# Patient Record
Sex: Female | Born: 1975
Health system: Southern US, Community
[De-identification: ages and names within clinical notes are randomized; demographics above are authoritative.]

## PROBLEM LIST (undated history)

## (undated) DIAGNOSIS — A749 Chlamydial infection, unspecified: Secondary | ICD-10-CM

## (undated) DIAGNOSIS — Z789 Other specified health status: Secondary | ICD-10-CM

## (undated) HISTORY — PX: HERNIA REPAIR: SHX51

## (undated) HISTORY — PX: APPENDECTOMY: SHX54

## (undated) HISTORY — PX: TUBAL LIGATION: SHX77

---

## 2000-06-13 ENCOUNTER — Emergency Department (HOSPITAL_COMMUNITY): Admission: EM | Admit: 2000-06-13 | Discharge: 2000-06-13 | Payer: Self-pay | Admitting: Emergency Medicine

## 2000-06-13 ENCOUNTER — Encounter: Payer: Self-pay | Admitting: Emergency Medicine

## 2001-09-21 ENCOUNTER — Inpatient Hospital Stay (HOSPITAL_COMMUNITY): Admission: AD | Admit: 2001-09-21 | Discharge: 2001-09-21 | Payer: Self-pay | Admitting: *Deleted

## 2002-03-10 ENCOUNTER — Emergency Department (HOSPITAL_COMMUNITY): Admission: EM | Admit: 2002-03-10 | Discharge: 2002-03-10 | Payer: Self-pay | Admitting: Emergency Medicine

## 2002-12-22 ENCOUNTER — Inpatient Hospital Stay (HOSPITAL_COMMUNITY): Admission: AD | Admit: 2002-12-22 | Discharge: 2002-12-22 | Payer: Self-pay | Admitting: Obstetrics and Gynecology

## 2002-12-22 ENCOUNTER — Encounter: Payer: Self-pay | Admitting: Obstetrics and Gynecology

## 2003-09-09 ENCOUNTER — Emergency Department (HOSPITAL_COMMUNITY): Admission: EM | Admit: 2003-09-09 | Discharge: 2003-09-09 | Payer: Self-pay | Admitting: Emergency Medicine

## 2003-09-13 ENCOUNTER — Emergency Department (HOSPITAL_COMMUNITY): Admission: EM | Admit: 2003-09-13 | Discharge: 2003-09-13 | Payer: Self-pay | Admitting: Family Medicine

## 2006-01-14 ENCOUNTER — Emergency Department (HOSPITAL_COMMUNITY): Admission: EM | Admit: 2006-01-14 | Discharge: 2006-01-14 | Payer: Self-pay | Admitting: Family Medicine

## 2006-02-13 ENCOUNTER — Emergency Department (HOSPITAL_COMMUNITY): Admission: EM | Admit: 2006-02-13 | Discharge: 2006-02-13 | Payer: Self-pay | Admitting: Family Medicine

## 2006-02-15 ENCOUNTER — Emergency Department (HOSPITAL_COMMUNITY): Admission: EM | Admit: 2006-02-15 | Discharge: 2006-02-15 | Payer: Self-pay | Admitting: Family Medicine

## 2006-03-16 ENCOUNTER — Emergency Department (HOSPITAL_COMMUNITY): Admission: EM | Admit: 2006-03-16 | Discharge: 2006-03-16 | Payer: Self-pay | Admitting: Family Medicine

## 2006-04-12 ENCOUNTER — Emergency Department (HOSPITAL_COMMUNITY): Admission: EM | Admit: 2006-04-12 | Discharge: 2006-04-12 | Payer: Self-pay | Admitting: Emergency Medicine

## 2006-05-22 ENCOUNTER — Emergency Department (HOSPITAL_COMMUNITY): Admission: EM | Admit: 2006-05-22 | Discharge: 2006-05-22 | Payer: Self-pay | Admitting: Emergency Medicine

## 2006-05-23 ENCOUNTER — Emergency Department (HOSPITAL_COMMUNITY): Admission: EM | Admit: 2006-05-23 | Discharge: 2006-05-23 | Payer: Self-pay | Admitting: Emergency Medicine

## 2007-04-28 ENCOUNTER — Emergency Department (HOSPITAL_COMMUNITY): Admission: EM | Admit: 2007-04-28 | Discharge: 2007-04-28 | Payer: Self-pay | Admitting: Emergency Medicine

## 2007-05-22 ENCOUNTER — Emergency Department (HOSPITAL_COMMUNITY): Admission: EM | Admit: 2007-05-22 | Discharge: 2007-05-22 | Payer: Self-pay | Admitting: Emergency Medicine

## 2007-12-07 ENCOUNTER — Emergency Department (HOSPITAL_COMMUNITY): Admission: EM | Admit: 2007-12-07 | Discharge: 2007-12-07 | Payer: Self-pay | Admitting: Emergency Medicine

## 2008-04-04 ENCOUNTER — Emergency Department (HOSPITAL_COMMUNITY): Admission: EM | Admit: 2008-04-04 | Discharge: 2008-04-05 | Payer: Self-pay | Admitting: *Deleted

## 2008-04-05 ENCOUNTER — Emergency Department (HOSPITAL_COMMUNITY): Admission: EM | Admit: 2008-04-05 | Discharge: 2008-04-05 | Payer: Self-pay | Admitting: Family Medicine

## 2009-08-02 ENCOUNTER — Emergency Department (HOSPITAL_COMMUNITY): Admission: EM | Admit: 2009-08-02 | Discharge: 2009-08-02 | Payer: Self-pay | Admitting: Family Medicine

## 2010-01-01 ENCOUNTER — Inpatient Hospital Stay (HOSPITAL_COMMUNITY): Admission: AD | Admit: 2010-01-01 | Discharge: 2010-01-01 | Payer: Self-pay | Admitting: Obstetrics & Gynecology

## 2010-04-10 ENCOUNTER — Emergency Department (HOSPITAL_COMMUNITY)
Admission: EM | Admit: 2010-04-10 | Discharge: 2010-04-10 | Payer: Self-pay | Source: Home / Self Care | Admitting: Family Medicine

## 2010-06-01 ENCOUNTER — Inpatient Hospital Stay (HOSPITAL_COMMUNITY)
Admission: AD | Admit: 2010-06-01 | Discharge: 2010-06-01 | Disposition: A | Discharge status: Home / Self Care | Payer: Medicaid Other | Source: Ambulatory Visit | Attending: Obstetrics & Gynecology | Admitting: Obstetrics & Gynecology

## 2010-06-01 DIAGNOSIS — R42 Dizziness and giddiness: Secondary | ICD-10-CM

## 2010-06-01 DIAGNOSIS — D649 Anemia, unspecified: Secondary | ICD-10-CM | POA: Insufficient documentation

## 2010-06-01 LAB — URINALYSIS, ROUTINE W REFLEX MICROSCOPIC
Bilirubin Urine: NEGATIVE
Hgb urine dipstick: NEGATIVE
Ketones, ur: NEGATIVE mg/dL
Nitrite: NEGATIVE
Protein, ur: NEGATIVE mg/dL
Specific Gravity, Urine: 1.015 (ref 1.005–1.030)
Urine Glucose, Fasting: NEGATIVE mg/dL
Urobilinogen, UA: 0.2 mg/dL (ref 0.0–1.0)
pH: 6.5 (ref 5.0–8.0)

## 2010-06-01 LAB — CBC
Hemoglobin: 11 g/dL — ABNORMAL LOW (ref 12.0–15.0)
MCHC: 32.3 g/dL (ref 30.0–36.0)
RBC: 3.95 MIL/uL (ref 3.87–5.11)

## 2010-06-01 LAB — COMPREHENSIVE METABOLIC PANEL
AST: 23 U/L (ref 0–37)
Albumin: 3.9 g/dL (ref 3.5–5.2)
Chloride: 103 mEq/L (ref 96–112)
Creatinine, Ser: 0.81 mg/dL (ref 0.4–1.2)
GFR calc Af Amer: >60 mL/min (ref >60)
Potassium: 3.6 mEq/L (ref 3.5–5.1)
Total Bilirubin: 0.3 mg/dL (ref 0.3–1.2)

## 2010-06-01 LAB — POCT PREGNANCY, URINE: Preg Test, Ur: NEGATIVE

## 2010-06-18 LAB — POCT URINALYSIS DIPSTICK
Ketones, ur: NEGATIVE mg/dL
Protein, ur: NEGATIVE mg/dL
pH: 6.5 (ref 5.0–8.0)

## 2010-06-21 LAB — URINALYSIS, ROUTINE W REFLEX MICROSCOPIC
Bilirubin Urine: NEGATIVE
Glucose, UA: NEGATIVE mg/dL
Hgb urine dipstick: NEGATIVE
Specific Gravity, Urine: 1.02 (ref 1.005–1.030)
Urobilinogen, UA: 0.2 mg/dL (ref 0.0–1.0)
pH: 7.5 (ref 5.0–8.0)

## 2010-06-21 LAB — GC/CHLAMYDIA PROBE AMP, GENITAL
Chlamydia, DNA Probe: NEGATIVE
GC Probe Amp, Genital: NEGATIVE

## 2010-06-21 LAB — CBC
Platelets: 282 10*3/uL (ref 150–400)
RBC: 3.62 MIL/uL — ABNORMAL LOW (ref 3.87–5.11)
WBC: 7.7 10*3/uL (ref 4.0–10.5)

## 2010-06-21 LAB — POCT PREGNANCY, URINE: Preg Test, Ur: NEGATIVE

## 2010-06-21 LAB — COMPREHENSIVE METABOLIC PANEL
AST: 19 U/L (ref 0–37)
Albumin: 4 g/dL (ref 3.5–5.2)
Alkaline Phosphatase: 47 U/L (ref 39–117)
Chloride: 102 mEq/L (ref 96–112)
GFR calc Af Amer: >60 mL/min (ref >60)
Potassium: 3.8 mEq/L (ref 3.5–5.1)
Sodium: 136 mEq/L (ref 135–145)
Total Bilirubin: 0.4 mg/dL (ref 0.3–1.2)
Total Protein: 6.7 g/dL (ref 6.0–8.3)

## 2010-06-21 LAB — WET PREP, GENITAL

## 2010-06-21 LAB — URINE MICROSCOPIC-ADD ON

## 2010-06-26 LAB — POCT URINALYSIS DIP (DEVICE)
Glucose, UA: NEGATIVE mg/dL
Ketones, ur: NEGATIVE mg/dL
Specific Gravity, Urine: 1.02 (ref 1.005–1.030)
Urobilinogen, UA: 0.2 mg/dL (ref 0.0–1.0)

## 2010-06-26 LAB — GC/CHLAMYDIA PROBE AMP, GENITAL: GC Probe Amp, Genital: NEGATIVE

## 2010-06-26 LAB — WET PREP, GENITAL: Trich, Wet Prep: NONE SEEN

## 2010-06-26 LAB — POCT PREGNANCY, URINE: Preg Test, Ur: NEGATIVE

## 2010-08-27 ENCOUNTER — Ambulatory Visit: Payer: Self-pay | Admitting: Family Medicine

## 2010-08-28 ENCOUNTER — Encounter: Payer: Self-pay | Admitting: Family Medicine

## 2010-08-28 ENCOUNTER — Ambulatory Visit (INDEPENDENT_AMBULATORY_CARE_PROVIDER_SITE_OTHER): Payer: Medicaid Other | Admitting: Family Medicine

## 2010-08-28 VITALS — BP 118/78 | HR 82 | Temp 99.0°F | Ht 64.5 in | Wt 120.0 lb

## 2010-08-28 DIAGNOSIS — N6459 Other signs and symptoms in breast: Secondary | ICD-10-CM

## 2010-08-28 DIAGNOSIS — N6019 Diffuse cystic mastopathy of unspecified breast: Secondary | ICD-10-CM | POA: Insufficient documentation

## 2010-08-28 DIAGNOSIS — Z87891 Personal history of nicotine dependence: Secondary | ICD-10-CM

## 2010-08-28 DIAGNOSIS — IMO0002 Reserved for concepts with insufficient information to code with codable children: Secondary | ICD-10-CM

## 2010-08-28 DIAGNOSIS — F1911 Other psychoactive substance abuse, in remission: Secondary | ICD-10-CM

## 2010-08-28 DIAGNOSIS — F17201 Nicotine dependence, unspecified, in remission: Secondary | ICD-10-CM | POA: Insufficient documentation

## 2010-08-28 NOTE — Patient Instructions (Signed)
It was nice to meet you.  I want you to make an appointment for your Well Woman Check up- and we will do your pap smear at that time.  It is OK to take tylenol or ibuprofen over the counter for your breast pain.

## 2010-08-29 ENCOUNTER — Encounter: Payer: Self-pay | Admitting: Family Medicine

## 2010-08-29 DIAGNOSIS — IMO0002 Reserved for concepts with insufficient information to code with codable children: Secondary | ICD-10-CM | POA: Insufficient documentation

## 2010-08-29 DIAGNOSIS — F1911 Other psychoactive substance abuse, in remission: Secondary | ICD-10-CM | POA: Insufficient documentation

## 2010-08-29 NOTE — Assessment & Plan Note (Signed)
Patient has been tobacco free for one year and is very excited about this.  She smoked for >15 years, so significant exposure, will monitor for adverse affects.

## 2010-08-29 NOTE — Assessment & Plan Note (Signed)
Pt with history of Marijuana use, but seems to have quit for good.  Will continue to monitor, especially during stressful times for her.

## 2010-08-29 NOTE — Progress Notes (Signed)
  Subjective:    Patient ID: Jordan Leblanc, female    DOB: 1975-04-24, 35 y.o.   MRN: [id number]  HPI Jordan Leblanc presents to establish care and has a complaint of bilateral breast pain.  She says she has always had pain in both breasts, and feels knotty masses.  These masses have not changed, and have been there for years.  The pain she experiences is significant, and she says it hurts all the time no matter what time of month it is.  She has occassionally taken Ibuprofen or tylenol for the pain.  She has never had any skin changes or nipple drainage.  She bottle fed her 3 children.   Patient also complains that she has pain with sexual activity.  She says she has always had this, but does not associate it with any urinary symptoms.    Patient does not know when her last pap smear is.  She is currently sexually active, and not using protection (s/p tubal ligation). She denies any vaginal symptoms (drainage, odor, pain when not having intercourse).    Patient quit smoking cigarettes about a year ago, and has recently quit smoking marijuana. She is very proud of these accomplishments.    Review of Systems  Constitutional: Negative for fever and unexpected weight change.  HENT: Negative for rhinorrhea.   Eyes: Negative for visual disturbance.  Respiratory: Negative for shortness of breath.   Cardiovascular: Negative for chest pain.  Gastrointestinal: Negative for abdominal pain.  Genitourinary: Negative for dysuria, difficulty urinating and genital sores.  Musculoskeletal: Negative for arthralgias.  Skin: Negative for rash.  Neurological: Negative for dizziness.  Psychiatric/Behavioral: Negative for dysphoric mood.       Objective:   Physical Exam BP 118/78  Pulse 82  Temp(Src) 99 F (37.2 C) (Oral)  Ht 5' 4.5" (1.638 m)  Wt 120 lb (54.432 kg)  BMI 20.28 kg/m2  LMP 08/07/2010 General appearance: alert, cooperative and no distress Head: Normocephalic, without obvious abnormality,  atraumatic Eyes: conjunctivae/corneas clear. PERRL, EOM's intact. Fundi benign. Ears: normal TM's and external ear canals both ears Throat: lips, mucosa, and tongue normal; teeth and gums normal Lungs: clear to auscultation bilaterally Breasts: No nipple retraction or dimpling, No nipple discharge or bleeding, No axillary or supraclavicular adenopathy, positive findings: fibrocystic changes Heart: regular rate and rhythm, S1, S2 normal, no murmur, click, rub or gallop Abdomen: soft, non-tender; bowel sounds normal; no masses,  no organomegaly Extremities: extremities normal, atraumatic, no cyanosis or edema Pulses: 2+ and symmetric       Assessment & Plan:

## 2010-08-29 NOTE — Assessment & Plan Note (Signed)
Patient does not have any exam findings concerning for malignancy, and has no family history.  Reviewed dietary recommendations for Fibrocystic breast changes, and reassured her. Hand out given.

## 2010-08-29 NOTE — Assessment & Plan Note (Signed)
Unclear what or if the cause of this is at this time.  Have asked patient to schedule her pap/well woman for next visit, will follow up.

## 2010-10-12 ENCOUNTER — Other Ambulatory Visit (HOSPITAL_COMMUNITY)
Admission: RE | Admit: 2010-10-12 | Discharge: 2010-10-12 | Disposition: A | Discharge status: Home / Self Care | Payer: Medicaid Other | Source: Ambulatory Visit | Attending: Family Medicine | Admitting: Family Medicine

## 2010-10-12 ENCOUNTER — Ambulatory Visit (INDEPENDENT_AMBULATORY_CARE_PROVIDER_SITE_OTHER): Payer: Medicaid Other | Admitting: Family Medicine

## 2010-10-12 ENCOUNTER — Encounter: Payer: Self-pay | Admitting: Family Medicine

## 2010-10-12 DIAGNOSIS — Z7251 High risk heterosexual behavior: Secondary | ICD-10-CM

## 2010-10-12 DIAGNOSIS — Z124 Encounter for screening for malignant neoplasm of cervix: Secondary | ICD-10-CM

## 2010-10-12 DIAGNOSIS — R5381 Other malaise: Secondary | ICD-10-CM

## 2010-10-12 DIAGNOSIS — R5383 Other fatigue: Secondary | ICD-10-CM | POA: Insufficient documentation

## 2010-10-12 DIAGNOSIS — K029 Dental caries, unspecified: Secondary | ICD-10-CM | POA: Insufficient documentation

## 2010-10-12 DIAGNOSIS — N760 Acute vaginitis: Secondary | ICD-10-CM

## 2010-10-12 DIAGNOSIS — A5901 Trichomonal vulvovaginitis: Secondary | ICD-10-CM

## 2010-10-12 DIAGNOSIS — Z01419 Encounter for gynecological examination (general) (routine) without abnormal findings: Secondary | ICD-10-CM | POA: Insufficient documentation

## 2010-10-12 LAB — POCT WET PREP (WET MOUNT): KOH Wet Prep POC: POSITIVE

## 2010-10-12 LAB — CBC
HCT: 34.7 % — ABNORMAL LOW (ref 36.0–46.0)
Hemoglobin: 11.7 g/dL — ABNORMAL LOW (ref 12.0–15.0)
MCH: 29.9 pg (ref 26.0–34.0)
MCV: 88.7 fL (ref 78.0–100.0)
Platelets: 274 10*3/uL (ref 150–400)
RBC: 3.91 MIL/uL (ref 3.87–5.11)
WBC: 5.9 10*3/uL (ref 4.0–10.5)

## 2010-10-12 MED ORDER — TRAMADOL HCL 50 MG PO TABS: 50.0000 mg | ORAL_TABLET | Freq: Four times a day (QID) | ORAL | Status: AC | PRN

## 2010-10-12 MED ORDER — METRONIDAZOLE 500 MG PO TABS: 2000.0000 mg | ORAL_TABLET | Freq: Once | ORAL | Status: AC

## 2010-10-12 MED ORDER — PENICILLIN V POTASSIUM 500 MG PO TABS: 500.0000 mg | ORAL_TABLET | Freq: Three times a day (TID) | ORAL | Status: AC

## 2010-10-12 NOTE — Patient Instructions (Signed)
It was good to see you, I am sorry you have not been feeling well.  I am treating you for trichomonas, a vaginal infection.  Your partner must be treated too. I have written a prescription for an antibiotic and tramadol to help with your tooth pain and swelling, but you need to see a dentist very soon, and may need that tooth pulled out.

## 2010-10-13 ENCOUNTER — Encounter: Payer: Self-pay | Admitting: Family Medicine

## 2010-10-13 LAB — GC/CHLAMYDIA PROBE AMP, GENITAL: GC Probe Amp, Genital: NEGATIVE

## 2010-10-13 NOTE — Assessment & Plan Note (Signed)
Wet prep + for trich, Rx Metronidazole for Trichomonas, did ask patient to notify ex-partner, advised about re-infection.   Pap also done as patient is overdue, G/C Chlamydia sent, Will Draw HIV and RPR.

## 2010-10-13 NOTE — Assessment & Plan Note (Signed)
Metronidazole. See Risky Sexual behavior plan.

## 2010-10-13 NOTE — Progress Notes (Signed)
Subjective:     Jordan Leblanc is a 35 y.o. female who presents for evaluation of an abnormal vaginal discharge. Symptoms have been present for 1 week. Vaginal symptoms: discharge described as white and malodorous, pain and vulvar itching. Contraception: tubal ligation. She denies abnormal bleeding, bumps, burning and urinary symptoms of chills, dysuria and urinary frequency Sexually transmitted infection risk: possible STD exposure and she states she recently found out her significant other was cheating on her, she says she is no longer seeing him but is worried about STD's and wants to be checked for everything. . Menstrual flow: regular every 28-30 days.  Patient also complains of tooth pain in a upper left sided molar.  She has had to have teeth pulled in the past but has not seen a dentist in at least a year.  She says she felt like part of that tooth chipped off.  She has been having some significant pain with chewing, feels like her gums have been swollen.  She denies any bleeding, drainage, or fevers.    Review of Systems Pertinent items are noted in HPI.    Objective:    BP 105/69  Pulse 90  Temp(Src) 98.3 F (36.8 C) (Oral)  Wt 116 lb (52.617 kg)  LMP 10/05/2010 General appearance: alert, cooperative and no distress Throat: abnormal findings: dentition: some mild erythema or upper gums, no over puss or abscess, tooth appears damaged.  No oral lesions.  Abdomen: soft, non-tender; bowel sounds normal; no masses,  no organomegaly Pelvic: external genitalia normal, no cervical motion tenderness, rectovaginal septum normal, uterus normal size, shape, and consistency, vagina normal without discharge and Cervix is friable, there is copious odorous grey discharge, no lesions of vaginal wall.     Assessment:    Trichomonas vaginalis. , Dental carry, likely chipped tooth   Plan:   Will Rx Metronidazole for Trichomonas, did ask patient to notify ex-partner, advised about re-infection.     Pap also done as patient is overdue, G/C Chlamydia sent, Will Draw HIV and RPR.  Rx PenVK for tooth infection and Tramadol for pain.  Advised sdeing dentitst ASAP.

## 2010-10-13 NOTE — Assessment & Plan Note (Signed)
Hx of anemia, recheck CBC.

## 2010-10-13 NOTE — Assessment & Plan Note (Signed)
Rx PenVK for tooth infection and Tramadol for pain.  Advised sdeing dentitst ASAP.

## 2010-10-29 ENCOUNTER — Encounter: Payer: Self-pay | Admitting: Family Medicine

## 2010-10-29 ENCOUNTER — Other Ambulatory Visit: Payer: Self-pay | Admitting: Family Medicine

## 2010-10-29 MED ORDER — FERROUS SULFATE 325 (65 FE) MG PO TABS: 325.0000 mg | ORAL_TABLET | Freq: Two times a day (BID) | ORAL | Status: DC

## 2010-11-06 ENCOUNTER — Telehealth: Payer: Self-pay | Admitting: Family Medicine

## 2010-11-06 NOTE — Telephone Encounter (Signed)
Called pt, she was not home.  Asked daughter to let her know I called and to call back.  Would advise she make an appointment to be seen if she is feeling so bad.  She should finish Flagyl.

## 2010-11-06 NOTE — Telephone Encounter (Signed)
One of the medications is making her so sick to take and would like something else.  She is having bad reactions and she also needs a note pertaining to the medications she is taking.  Is not able to eat anything.  Please give her a call.

## 2010-11-06 NOTE — Telephone Encounter (Signed)
Pt states that ever since taking the flagyl 2 weeks ago she has been sick to her stomach has no appetite.  Also the tramadol has made her feel bad as well.  She has not made a dentist appt yet but will soon.  And needs a note stating that she is taking these meds and they are making her sick for her job.  Advised that MD may want her to be seen again.  Will forward to MD Joye Wesenberg, Maryjo Rochester

## 2010-11-07 MED ORDER — NAPROXEN 500 MG PO TABS: 500.0000 mg | ORAL_TABLET | Freq: Two times a day (BID) | ORAL | Status: AC

## 2010-11-07 NOTE — Telephone Encounter (Signed)
Rx Naproxen sent to pharmacy, letter written.  Please let her know. She needs to see the dentist.  Will not Rx narcotics in this pt as she has history of substance abuse problems.

## 2010-11-07 NOTE — Telephone Encounter (Signed)
Pt has finished flagyl - she had to stop the Tramadol b/c it is making her sick.  She needs a note stating that she was given pain meds and it made her sick.  She is wanting a different kind of pain med that would help her.  She does not feel that she needs to come in again.  She just needed to switch her pain med.  She is continuing to take her pvn.

## 2010-11-28 ENCOUNTER — Emergency Department (HOSPITAL_COMMUNITY)
Admission: EM | Admit: 2010-11-28 | Discharge: 2010-11-29 | Disposition: A | Discharge status: Home / Self Care | Payer: Medicaid Other | Attending: Emergency Medicine | Admitting: Emergency Medicine

## 2010-11-28 DIAGNOSIS — S61409A Unspecified open wound of unspecified hand, initial encounter: Secondary | ICD-10-CM | POA: Insufficient documentation

## 2010-11-28 DIAGNOSIS — M79609 Pain in unspecified limb: Secondary | ICD-10-CM | POA: Insufficient documentation

## 2010-11-28 DIAGNOSIS — W268XXA Contact with other sharp object(s), not elsewhere classified, initial encounter: Secondary | ICD-10-CM | POA: Insufficient documentation

## 2010-12-27 LAB — URINALYSIS, ROUTINE W REFLEX MICROSCOPIC
Glucose, UA: NEGATIVE
Ketones, ur: NEGATIVE
Nitrite: POSITIVE — AB
Protein, ur: >300 — AB
Specific Gravity, Urine: 1.019
pH: 7

## 2010-12-28 LAB — URINALYSIS, ROUTINE W REFLEX MICROSCOPIC
Glucose, UA: NEGATIVE
Hgb urine dipstick: NEGATIVE
Ketones, ur: NEGATIVE
pH: 6.5

## 2010-12-28 LAB — WET PREP, GENITAL: Trich, Wet Prep: NONE SEEN

## 2011-01-04 ENCOUNTER — Encounter: Payer: Self-pay | Admitting: Family Medicine

## 2011-01-04 ENCOUNTER — Ambulatory Visit (INDEPENDENT_AMBULATORY_CARE_PROVIDER_SITE_OTHER): Payer: Medicaid Other | Admitting: Family Medicine

## 2011-01-04 VITALS — BP 125/91 | HR 115 | Temp 98.1°F | Ht 64.5 in | Wt 111.0 lb

## 2011-01-04 DIAGNOSIS — A5901 Trichomonal vulvovaginitis: Secondary | ICD-10-CM

## 2011-01-04 MED ORDER — METRONIDAZOLE 500 MG PO TABS: 500.0000 mg | ORAL_TABLET | Freq: Two times a day (BID) | ORAL | Status: DC

## 2011-01-04 NOTE — Patient Instructions (Signed)
Take the medication as prescribed.  Please do not have unprotected sex with partners that may be risky.

## 2011-01-04 NOTE — Progress Notes (Signed)
  Subjective:    Patient ID: Jordan Leblanc, female    DOB: 1976/03/24, 35 y.o.   MRN: [id number]  HPI 1. Trichomonas Patient has recurrent trichomonas infection. She was recently treated two months ago for trich. She has had sexual intercourse with the same female that gave her trich the first time. She has fishy, thin, foul smelling, grey vaginal discharge and abdominal discomfort.   Review of Systems  Constitutional: Negative for fever and chills.  Respiratory: Negative for shortness of breath.   Cardiovascular: Negative for chest pain.  Gastrointestinal: Positive for abdominal pain. Negative for nausea, vomiting, diarrhea, constipation, blood in stool, abdominal distention and rectal pain.  Genitourinary: Positive for vaginal discharge. Negative for dysuria, hematuria, decreased urine volume, vaginal bleeding, difficulty urinating and vaginal pain.  Skin: Negative for rash.       Objective:   Physical Exam  Nursing note and vitals reviewed. Constitutional: She appears well-developed and well-nourished. No distress.  Cardiovascular: Normal rate, regular rhythm and normal heart sounds.   No murmur heard. Pulmonary/Chest: Effort normal and breath sounds normal. No respiratory distress. She has no wheezes.  Abdominal: Soft. Bowel sounds are normal. She exhibits no distension. There is no tenderness. There is no rebound.  Skin: She is not diaphoretic.  genital: deferred    Assessment & Plan:  1. Trichomonas Recent infection with identical symptoms Will treat with Flagyl 500 mg BID for 5 days. Advised to only have protected sexual intercourse.

## 2011-01-04 NOTE — Assessment & Plan Note (Signed)
Flagyl 500 mg BID for 5 days. Recurrent From same partner who was treated previously.

## 2011-01-11 LAB — POCT URINALYSIS DIP (DEVICE)
Ketones, ur: 15 mg/dL — AB
Specific Gravity, Urine: 1.02 (ref 1.005–1.030)
pH: 5.5 (ref 5.0–8.0)

## 2011-01-11 LAB — WET PREP, GENITAL: Yeast Wet Prep HPF POC: NONE SEEN

## 2011-01-11 LAB — POCT PREGNANCY, URINE: Preg Test, Ur: NEGATIVE

## 2011-02-07 ENCOUNTER — Encounter: Payer: Self-pay | Admitting: Family Medicine

## 2011-02-07 ENCOUNTER — Ambulatory Visit (INDEPENDENT_AMBULATORY_CARE_PROVIDER_SITE_OTHER): Payer: Medicaid Other | Admitting: Family Medicine

## 2011-02-07 VITALS — BP 122/75 | HR 95 | Temp 97.3°F | Ht 64.6 in | Wt 108.0 lb

## 2011-02-07 DIAGNOSIS — A499 Bacterial infection, unspecified: Secondary | ICD-10-CM

## 2011-02-07 DIAGNOSIS — A5901 Trichomonal vulvovaginitis: Secondary | ICD-10-CM

## 2011-02-07 DIAGNOSIS — B9689 Other specified bacterial agents as the cause of diseases classified elsewhere: Secondary | ICD-10-CM

## 2011-02-07 DIAGNOSIS — N760 Acute vaginitis: Secondary | ICD-10-CM

## 2011-02-07 DIAGNOSIS — Z23 Encounter for immunization: Secondary | ICD-10-CM

## 2011-02-07 LAB — POCT WET PREP (WET MOUNT)
Trichomonas Wet Prep HPF POC: NEGATIVE
Yeast Wet Prep HPF POC: NEGATIVE

## 2011-02-07 MED ORDER — METRONIDAZOLE 500 MG PO TABS: 500.0000 mg | ORAL_TABLET | Freq: Two times a day (BID) | ORAL | Status: AC

## 2011-02-07 NOTE — Patient Instructions (Signed)
Bacterial Vaginosis Bacterial vaginosis (BV) is a vaginal infection where the normal balance of bacteria in the vagina is disrupted. The normal balance is then replaced by an overgrowth of certain bacteria. There are several different kinds of bacteria that can cause BV. BV is the most common vaginal infection in women of childbearing age. CAUSES   The cause of BV is not fully understood. BV develops when there is an increase or imbalance of harmful bacteria.   Some activities or behaviors can upset the normal balance of bacteria in the vagina and put women at increased risk including:   Having a new sex partner or multiple sex partners.   Douching.   Using an intrauterine device (IUD) for contraception.   It is not clear what role sexual activity plays in the development of BV. However, women that have never had sexual intercourse are rarely infected with BV.  Women do not get BV from toilet seats, bedding, swimming pools or from touching objects around them.  SYMPTOMS   Grey vaginal discharge.   A fish-like odor with discharge, especially after sexual intercourse.   Itching or burning of the vagina and vulva.   Burning or pain with urination.   Some women have no signs or symptoms at all.  DIAGNOSIS  Your caregiver must examine the vagina for signs of BV. Your caregiver will perform lab tests and look at the sample of vaginal fluid through a microscope. They will look for bacteria and abnormal cells (clue cells), a pH test higher than 4.5, and a positive amine test all associated with BV.  RISKS AND COMPLICATIONS   Pelvic inflammatory disease (PID).   Infections following gynecology surgery.   Developing HIV.   Developing herpes virus.  TREATMENT  Sometimes BV will clear up without treatment. However, all women with symptoms of BV should be treated to avoid complications, especially if gynecology surgery is planned. Female partners generally do not need to be treated. However,  BV may spread between female sex partners so treatment is helpful in preventing a recurrence of BV.   BV may be treated with antibiotics. The antibiotics come in either pill or vaginal cream forms. Either can be used with nonpregnant or pregnant women, but the recommended dosages differ. These antibiotics are not harmful to the baby.   BV can recur after treatment. If this happens, a second round of antibiotics will often be prescribed.   Treatment is important for pregnant women. If not treated, BV can cause a premature delivery, especially for a pregnant woman who had a premature birth in the past. All pregnant women who have symptoms of BV should be checked and treated.   For chronic reoccurrence of BV, treatment with a type of prescribed gel vaginally twice a week is helpful.  HOME CARE INSTRUCTIONS   Finish all medication as directed by your caregiver.   Do not have sex until treatment is completed.   Tell your sexual partner that you have a vaginal infection. They should see their caregiver and be treated if they have problems, such as a mild rash or itching.   Practice safe sex. Use condoms. Only have 1 sex partner.  PREVENTION  Basic prevention steps can help reduce the risk of upsetting the natural balance of bacteria in the vagina and developing BV:  Do not have sexual intercourse (be abstinent).   Do not douche.   Use all of the medicine prescribed for treatment of BV, even if the signs and symptoms go away.     Tell your sex partner if you have BV. That way, they can be treated, if needed, to prevent reoccurrence.  SEEK MEDICAL CARE IF:   Your symptoms are not improving after 3 days of treatment.   You have increased discharge, pain, or fever.  MAKE SURE YOU:   Understand these instructions.   Will watch your condition.   Will get help right away if you are not doing well or get worse.  FOR MORE INFORMATION  Division of STD Prevention (DSTDP), Centers for Disease  Control and Prevention: www.cdc.gov/std American Social Health Association (ASHA): www.ashastd.org  Document Released: 03/25/2005 Document Revised: 12/05/2010 Document Reviewed: 09/15/2008 Exit[employer] Patient Information 2012 [employer]. 

## 2011-02-07 NOTE — Progress Notes (Signed)
  Subjective:    Patient ID: Jordan Leblanc, female    DOB: Mar 30, 1976, 35 y.o.   MRN: [id number]  HPI  Patient presents to clinic with foul vaginal odor.  Symptoms started yesterday.  Patient has had one sexual partner in the last 6 months, but she thinks he keeps giving her Trich.  She was positive for Trich in July - both patient and partner were treated at that time.  This time she complains of pain with intercourse, pelvic pain (intermittent, shooting), and associated low back pain.  Denies fever, chills, night sweats.  Denies vaginal bleeding.  Denies dysuria, burning, or vaginal itching.  Endorses scant clear vaginal discharge.   Review of Systems  Per HPI    Objective:   Physical Exam  Constitutional: No distress.  Abdominal: Soft. She exhibits no distension. There is no tenderness. There is no rebound and no guarding.  Genitourinary: Vagina normal. There is no rash, tenderness, lesion or injury on the right labia. There is no rash, tenderness, lesion or injury on the left labia. Cervix exhibits discharge. Cervix exhibits no motion tenderness and no friability. Right adnexum displays no mass and no tenderness. Left adnexum displays no mass and no tenderness.  Skin: Skin is dry. No rash noted. No erythema.  MSK: negative for back pain, negative Loyd's sign       Assessment & Plan:

## 2011-02-07 NOTE — Assessment & Plan Note (Signed)
Wet prep positive for BV. Flagyl 500 BID x 7 days. Discussed results with patient, handout given. Follow up with PCP if symptoms worsen.

## 2011-02-08 LAB — GC/CHLAMYDIA PROBE AMP, GENITAL: GC Probe Amp, Genital: NEGATIVE

## 2012-06-25 ENCOUNTER — Emergency Department (HOSPITAL_COMMUNITY)
Admission: EM | Admit: 2012-06-25 | Discharge: 2012-06-25 | Disposition: A | Discharge status: Home / Self Care | Payer: Self-pay | Attending: Emergency Medicine | Admitting: Emergency Medicine

## 2012-06-25 ENCOUNTER — Encounter (HOSPITAL_COMMUNITY): Payer: Self-pay | Admitting: Emergency Medicine

## 2012-06-25 DIAGNOSIS — M25512 Pain in left shoulder: Secondary | ICD-10-CM

## 2012-06-25 DIAGNOSIS — F17200 Nicotine dependence, unspecified, uncomplicated: Secondary | ICD-10-CM | POA: Insufficient documentation

## 2012-06-25 DIAGNOSIS — M79602 Pain in left arm: Secondary | ICD-10-CM

## 2012-06-25 DIAGNOSIS — R200 Anesthesia of skin: Secondary | ICD-10-CM

## 2012-06-25 DIAGNOSIS — M542 Cervicalgia: Secondary | ICD-10-CM | POA: Insufficient documentation

## 2012-06-25 DIAGNOSIS — R209 Unspecified disturbances of skin sensation: Secondary | ICD-10-CM | POA: Insufficient documentation

## 2012-06-25 DIAGNOSIS — M25519 Pain in unspecified shoulder: Secondary | ICD-10-CM | POA: Insufficient documentation

## 2012-06-25 MED ORDER — KETOROLAC TROMETHAMINE 60 MG/2ML IM SOLN
60.0000 mg | Freq: Once | INTRAMUSCULAR | Status: AC
  Administered 2012-06-25: 60 mg via INTRAMUSCULAR
  Filled 2012-06-25: qty 2

## 2012-06-25 MED ORDER — ORPHENADRINE CITRATE 30 MG/ML IJ SOLN: 60.0000 mg | Freq: Two times a day (BID) | INTRAMUSCULAR | Status: DC

## 2012-06-25 MED ORDER — ORPHENADRINE CITRATE ER 100 MG PO TB12
100.0000 mg | ORAL_TABLET | Freq: Once | ORAL | Status: AC
  Administered 2012-06-25: 100 mg via ORAL
  Filled 2012-06-25: qty 1

## 2012-06-25 MED ORDER — IBUPROFEN 600 MG PO TABS: 600.0000 mg | ORAL_TABLET | Freq: Three times a day (TID) | ORAL | Status: DC

## 2012-06-25 NOTE — ED Provider Notes (Addendum)
37 year old female has a job involving some repetitive motion and she came in complaining of pain in her left arm and some tingling in her left hand. Symptoms are more on the ulnar aspect. On exam, she does has weakness of abduction of her fingers and some weakness of flexion of her fourth and fifth fingers. There is some decreased sensation over the ulnar aspect of the hand and this is felt to be consistent with an ulnar neuropathy. Symptoms are reproduced by percussion over the olecranon. As she feels better after receiving NSAIDs in the ED and she will be referred to hand surgery for followup.  ECG shows normal sinus rhythm with a rate of 73, no ectopy. Normal axis. Normal P wave. Normal QRS. Normal intervals. Normal ST and T waves. Impression: normal ECG. No prior ECG available for comparison  I saw and evaluated the patient, reviewed the resident's note and I agree with the findings and plan.   Dione Booze, MD 06/25/12 1204  Dione Booze, MD 06/26/12 2240

## 2012-06-25 NOTE — ED Notes (Signed)
Waiting on medication from pharmacy.  Pharmacy called, they are sending medicine.

## 2012-06-25 NOTE — ED Notes (Signed)
Patient states that she started having pains on her L side.  Patient states that she started having pain all over her L side.  Patient states her arm feels numb at times and is painful at times.  Patient denies any other symptoms.   Patient claims cannot grip with L hand because it hurts too bad.

## 2012-06-25 NOTE — ED Notes (Signed)
Medication order sent to pharmacy for PO medication.  Pt made aware of delay in pain medication administration.  States an understanding.

## 2012-06-25 NOTE — ED Provider Notes (Signed)
History     CSN: [id number]  Arrival date & time 06/25/12  6213   First MD Initiated Contact with Patient 06/25/12 [phone]      Chief Complaint  Patient presents with  . Arm Pain    left  . Leg Pain    left    (Consider location/radiation/quality/duration/timing/severity/associated sxs/prior treatment) Patient is a 37 y.o. female presenting with arm pain. The history is provided by the patient.  Arm Pain This is a new problem. The current episode started in the past 7 days. The problem occurs constantly. The problem has been gradually worsening. Pertinent negatives include no abdominal pain, chest pain, fever or headaches. Associated symptoms comments: tingling. Nothing aggravates the symptoms. She has tried NSAIDs for the symptoms. The treatment provided mild relief.    History reviewed. No pertinent past medical history.  Past Surgical History  Procedure Laterality Date  . Appendectomy    . Hernia repair    . Tubal ligation      Family History  Problem Relation Age of Onset  . Heart disease Mother   . Hyperlipidemia Mother   . Hypertension Mother   . Diabetes Father   . Heart disease Father   . Hyperlipidemia Father   . Hypertension Father   . Cancer Father   . Stroke Father   . Asthma Maternal Aunt     History  Substance Use Topics  . Smoking status: Current Every Day Smoker -- 1.00 packs/day    Types: Cigars  . Smokeless tobacco: Not on file  . Alcohol Use: No     Comment: Pt only has a drink a few times a year.     OB History   Grav Para Term Preterm Abortions TAB SAB Ect Mult Living                  Review of Systems  Constitutional: Negative for fever.  Respiratory: Negative for shortness of breath.   Cardiovascular: Negative for chest pain.  Gastrointestinal: Negative for abdominal pain.  Neurological: Negative for headaches.  All other systems reviewed and are negative.    Allergies  Review of patient's allergies indicates no known  allergies.  Home Medications  No current outpatient prescriptions on file.  BP 127/77  Pulse 82  Temp(Src) 98.4 F (36.9 C)  Resp 18  Ht 5\' 4"  (1.626 m)  Wt 105 lb (47.628 kg)  BMI 18.01 kg/m2  SpO2 100%  LMP 06/25/2012  Physical Exam  Nursing note and vitals reviewed. Constitutional: She is oriented to person, place, and time. She appears well-developed and well-nourished. No distress.  HENT:  Head: Normocephalic and atraumatic.  Mouth/Throat: Oropharynx is clear and moist.  Eyes: Conjunctivae are normal. Pupils are equal, round, and reactive to light. No scleral icterus.  Neck: Neck supple. No spinous process tenderness present.  Cardiovascular: Normal rate, regular rhythm, normal heart sounds and intact distal pulses.   No murmur heard. Pulmonary/Chest: Effort normal and breath sounds normal. No stridor. No respiratory distress. She has no rales.  Abdominal: Soft. Bowel sounds are normal. She exhibits no distension. There is no tenderness.  Musculoskeletal: Normal range of motion.       Right shoulder: She exhibits normal range of motion, no swelling, no effusion, normal pulse and normal strength (pt has hard time gripping secondary to pain, but when tested individually, muscle strength of finger extensors and flexors is intact. ).       Arms: Neurological: She is alert and oriented to person,  place, and time. No sensory deficit. Coordination and gait normal.  Skin: Skin is warm and dry. No rash noted.  Psychiatric: She has a normal mood and affect. Her behavior is normal.    ED Course  Procedures (including critical care time)  Labs Reviewed - No data to display No results found.   1. Hand numbness   2. Arm pain, left   3. Left shoulder pain       MDM  37 yo female with left neck, shoulder, and arm pain.  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  Has pain with grip strength and some  tingling in fingers, but strength and sensation are intact.  Clinical picture not c/w CVA.  Possible cervical radiculopathy, but more likely muscle spasm.  Plan to treat with norflex and Toradol.    Symptoms improved and more localized to ulnar nerve distribution.  Referred to hand surgery for further evaluation.  dc'd home.        Rennis Petty, MD 06/25/12 [phone]

## 2012-07-01 ENCOUNTER — Encounter (HOSPITAL_COMMUNITY): Payer: Self-pay | Admitting: Adult Health

## 2012-07-01 ENCOUNTER — Emergency Department (HOSPITAL_COMMUNITY): Payer: Self-pay

## 2012-07-01 ENCOUNTER — Emergency Department (HOSPITAL_COMMUNITY)
Admission: EM | Admit: 2012-07-01 | Discharge: 2012-07-01 | Disposition: A | Discharge status: Home / Self Care | Payer: Self-pay | Attending: Emergency Medicine | Admitting: Emergency Medicine

## 2012-07-01 DIAGNOSIS — R42 Dizziness and giddiness: Secondary | ICD-10-CM | POA: Insufficient documentation

## 2012-07-01 DIAGNOSIS — F17200 Nicotine dependence, unspecified, uncomplicated: Secondary | ICD-10-CM | POA: Insufficient documentation

## 2012-07-01 DIAGNOSIS — R1110 Vomiting, unspecified: Secondary | ICD-10-CM | POA: Insufficient documentation

## 2012-07-01 DIAGNOSIS — R0789 Other chest pain: Secondary | ICD-10-CM | POA: Insufficient documentation

## 2012-07-01 DIAGNOSIS — R0602 Shortness of breath: Secondary | ICD-10-CM | POA: Insufficient documentation

## 2012-07-01 LAB — BASIC METABOLIC PANEL
Chloride: 100 mEq/L (ref 96–112)
Creatinine, Ser: 0.88 mg/dL (ref 0.50–1.10)
GFR calc Af Amer: >90 mL/min (ref >90)
Potassium: 3.5 mEq/L (ref 3.5–5.1)
Sodium: 138 mEq/L (ref 135–145)

## 2012-07-01 LAB — CBC
Platelets: 333 10*3/uL (ref 150–400)
RDW: 17.4 % — ABNORMAL HIGH (ref 11.5–15.5)
WBC: 7.3 10*3/uL (ref 4.0–10.5)

## 2012-07-01 LAB — POCT I-STAT TROPONIN I: Troponin i, poc: 0.01 ng/mL (ref 0.00–0.08)

## 2012-07-01 LAB — D-DIMER, QUANTITATIVE: D-Dimer, Quant: <0.27 ug/mL-FEU (ref 0.00–0.48)

## 2012-07-01 NOTE — ED Notes (Signed)
Pt c/o right sided chest pain that radiates into right neck and down into back. Pt reports she was just recently here and was told she had carpal tunnel but sts she went to the specialist today around 330pm and they told her she didn't so now she is upset and wants to know what is causing her pain. Pt in nad, ambulatory, skin warm and dry, resp e/u.

## 2012-07-01 NOTE — ED Notes (Addendum)
Presents with sharp sternal/right sided chest pain that radiates to back and up into right neck, into right shoulder and down to bottom of right ribcage associated with nausea, vomiting, dizziness and worse pain with deep inspiration and movement. Pt states that both arms feel numb.  She was seen here for same thing but on the left side last week. She states, "My body don't feel good something is wrong and it is not nerve compression or carpal tunnel"

## 2012-07-01 NOTE — ED Provider Notes (Signed)
History     CSN: [id number]  Arrival date & time 07/01/12  1746   First MD Initiated Contact with Patient 07/01/12 1909      Chief Complaint  Patient presents with  . Chest Pain    (Consider location/radiation/quality/duration/timing/severity/associated sxs/prior treatment) HPI Complains of right-sided chest pain onset upon awakening this morning sharp in quality worse with changing positions or deep inspiration improved with remaining still accompanied by shortness of breath, mild. patient vomited this morning. She also reports that both arms feel known for "several months.," Unchanged denies abdominal pain. Last menstrual period currently. Patient also admits to dizziness meaning symptoms of vertigo and lightheadedness. She's been prescribed Motrin which she's taken without relief. She declines any further pain medicine. History reviewed. No pertinent past medical history.  Past Surgical History  Procedure Laterality Date  . Appendectomy    . Hernia repair    . Tubal ligation      Family History  Problem Relation Age of Onset  . Heart disease Mother   . Hyperlipidemia Mother   . Hypertension Mother   . Diabetes Father   . Heart disease Father   . Hyperlipidemia Father   . Hypertension Father   . Cancer Father   . Stroke Father   . Asthma Maternal Aunt     History  Substance Use Topics  . Smoking status: Current Every Day Smoker -- 1.00 packs/day    Types: Cigars  . Smokeless tobacco: Not on file  . Alcohol Use: No     Comment: Pt only has a drink a few times a year.     OB History   Grav Para Term Preterm Abortions TAB SAB Ect Mult Living                  Review of Systems  Respiratory: Positive for shortness of breath.   Cardiovascular: Positive for chest pain.  Genitourinary:       Currently on menses  Neurological: Positive for dizziness.  All other systems reviewed and are negative.    Allergies  Review of patient's allergies indicates no known  allergies.  Home Medications  No current outpatient prescriptions on file.  BP 115/85  Pulse 79  Temp(Src) 97.4 F (36.3 C) (Oral)  Resp 19  SpO2 100%  LMP 06/25/2012  Physical Exam  Nursing note and vitals reviewed. Constitutional: No distress.  Cachectic  HENT:  Head: Normocephalic and atraumatic.  Eyes: Conjunctivae are normal. Pupils are equal, round, and reactive to light.  Neck: Neck supple. No tracheal deviation present. No thyromegaly present.  Cardiovascular: Normal rate and regular rhythm.   No murmur heard. Pulmonary/Chest: Effort normal and breath sounds normal. She exhibits tenderness.  Tender right anterior chest. Pain is exacerbated by forcible abduction of right shoulder  Abdominal: Soft. Bowel sounds are normal. She exhibits no distension. There is no tenderness.  Musculoskeletal: Normal range of motion. She exhibits no edema and no tenderness.  Neurological: She is alert. Coordination normal.  Skin: Skin is warm and dry. No rash noted.  Psychiatric: She has a normal mood and affect.    ED Course  Procedures (including critical care time)  Labs Reviewed  CBC - Abnormal; Notable for the following:    RDW 17.4 (*)    All other components within normal limits  BASIC METABOLIC PANEL - Abnormal; Notable for the following:    GFR calc non Af Amer 83 (*)    All other components within normal limits  POCT I-STAT  TROPONIN I   Dg Chest 2 View  07/01/2012  *RADIOLOGY REPORT*  Clinical Data: Chest pain.  CHEST - 2 VIEW  Comparison: 12/07/2007.  Findings: The cardiac silhouette, mediastinal and hilar contours are normal and stable.  The lungs are clear.  No pleural effusion. The bony thorax is intact.  IMPRESSION: Normal chest x-ray.   Original Report Authenticated By: Rudie Meyer, M.D.      No diagnosis found.    Date: 07/01/2012  Rate: 85  Rhythm: normal sinus rhythm  QRS Axis: normal  Intervals: normal  ST/T Wave abnormalities: nonspecific T wave  changes  Conduction Disutrbances:none  Narrative Interpretation:   Old EKG Reviewed: unchanged Unchanged from 06/26/2011 interpreted by me Results for orders placed during the hospital encounter of 07/01/12  CBC      Result Value Range   WBC 7.3  4.0 - 10.5 K/uL   RBC 4.39  3.87 - 5.11 MIL/uL   Hemoglobin 12.4  12.0 - 15.0 g/dL   HCT 16.1  09.6 - 04.5 %   MCV 84.7  78.0 - 100.0 fL   MCH 28.2  26.0 - 34.0 pg   MCHC 33.3  30.0 - 36.0 g/dL   RDW 40.9 (*) 81.1 - 91.4 %   Platelets 333  150 - 400 K/uL  BASIC METABOLIC PANEL      Result Value Range   Sodium 138  135 - 145 mEq/L   Potassium 3.5  3.5 - 5.1 mEq/L   Chloride 100  96 - 112 mEq/L   CO2 25  19 - 32 mEq/L   Glucose, Bld 97  70 - 99 mg/dL   BUN 13  6 - 23 mg/dL   Creatinine, Ser 7.82  0.50 - 1.10 mg/dL   Calcium 9.7  8.4 - 95.6 mg/dL   GFR calc non Af Amer 83 (*) >90 mL/min   GFR calc Af Amer >90  >90 mL/min  D-DIMER, QUANTITATIVE      Result Value Range   D-Dimer, Quant <0.27  0.00 - 0.48 ug/mL-FEU  POCT I-STAT TROPONIN I      Result Value Range   Troponin i, poc 0.01  0.00 - 0.08 ng/mL   Comment 3            Dg Chest 2 View  07/01/2012  *RADIOLOGY REPORT*  Clinical Data: Chest pain.  CHEST - 2 VIEW  Comparison: 12/07/2007.  Findings: The cardiac silhouette, mediastinal and hilar contours are normal and stable.  The lungs are clear.  No pleural effusion. The bony thorax is intact.  IMPRESSION: Normal chest x-ray.   Original Report Authenticated By: Rudie Meyer, M.D.     Chest x-ray viewed by me  Patient declined pain medicine  8:20 PM resting comfortably MDM  Strongly doubt acute coronary syndrome in this young female with highly atypical symptoms. Low to pretest probability for pulmonary embolism. Exam and symptoms most consistent with musculoskeletal chest pain Spent 5 minutes counseling patient on smoking cessation. She has ibuprofen at home which he can take for pain. Referral resource guide Diagnosis #1  atypical chest pain #2 tobacco abuse        Doug Sou, MD 07/01/12 2024

## 2013-04-03 ENCOUNTER — Encounter (HOSPITAL_COMMUNITY): Payer: Self-pay | Admitting: *Deleted

## 2013-04-03 ENCOUNTER — Inpatient Hospital Stay (HOSPITAL_COMMUNITY)
Admission: AD | Admit: 2013-04-03 | Discharge: 2013-04-03 | Disposition: A | Discharge status: Home / Self Care | Payer: Medicaid Other | Source: Ambulatory Visit | Attending: Obstetrics & Gynecology | Admitting: Obstetrics & Gynecology

## 2013-04-03 DIAGNOSIS — N949 Unspecified condition associated with female genital organs and menstrual cycle: Secondary | ICD-10-CM | POA: Insufficient documentation

## 2013-04-03 DIAGNOSIS — N760 Acute vaginitis: Secondary | ICD-10-CM | POA: Insufficient documentation

## 2013-04-03 DIAGNOSIS — N939 Abnormal uterine and vaginal bleeding, unspecified: Secondary | ICD-10-CM

## 2013-04-03 DIAGNOSIS — F17200 Nicotine dependence, unspecified, uncomplicated: Secondary | ICD-10-CM | POA: Insufficient documentation

## 2013-04-03 DIAGNOSIS — B9689 Other specified bacterial agents as the cause of diseases classified elsewhere: Secondary | ICD-10-CM | POA: Insufficient documentation

## 2013-04-03 DIAGNOSIS — A5901 Trichomonal vulvovaginitis: Secondary | ICD-10-CM | POA: Insufficient documentation

## 2013-04-03 DIAGNOSIS — A599 Trichomoniasis, unspecified: Secondary | ICD-10-CM

## 2013-04-03 DIAGNOSIS — N938 Other specified abnormal uterine and vaginal bleeding: Secondary | ICD-10-CM | POA: Insufficient documentation

## 2013-04-03 DIAGNOSIS — A499 Bacterial infection, unspecified: Secondary | ICD-10-CM

## 2013-04-03 HISTORY — DX: Other specified health status: Z78.9

## 2013-04-03 LAB — URINALYSIS, ROUTINE W REFLEX MICROSCOPIC
Bilirubin Urine: NEGATIVE
Hgb urine dipstick: NEGATIVE
Protein, ur: NEGATIVE mg/dL
Urobilinogen, UA: 0.2 mg/dL (ref 0.0–1.0)
pH: 6 (ref 5.0–8.0)

## 2013-04-03 LAB — CBC
MCV: 80.4 fL (ref 78.0–100.0)
Platelets: 305 10*3/uL (ref 150–400)
RDW: 19.6 % — ABNORMAL HIGH (ref 11.5–15.5)
WBC: 5.8 10*3/uL (ref 4.0–10.5)

## 2013-04-03 MED ORDER — METRONIDAZOLE 500 MG PO TABS: 500.0000 mg | ORAL_TABLET | Freq: Two times a day (BID) | ORAL | Status: DC

## 2013-04-03 MED ORDER — FERROUS SULFATE 325 (65 FE) MG PO TABS: 325.0000 mg | ORAL_TABLET | Freq: Every day | ORAL | Status: DC

## 2013-04-03 NOTE — MAU Provider Note (Signed)
Attestation of Attending Supervision of Advanced Practitioner (CNM/NP): Evaluation and management procedures were performed by the Advanced Practitioner under my supervision and collaboration. I have reviewed the Advanced Practitioner's note and chart, and I agree with the management and plan.  Kimbella Heisler H. 12:40 PM   

## 2013-04-03 NOTE — MAU Provider Note (Signed)
History     CSN: [id number]  Arrival date and time: 04/03/13 0945   None     Chief Complaint  Patient presents with  . Vaginal Bleeding   HPI  [name] is a 37 y.o. female [phone] non-pregnant female who presents with abnormal vaginal bleeding that started three months. The pattern consists of two menstrual cycles each month; each cycle lasting around 6 days. She denies pain or discomfort. She has a primary care Dr and she mentioned this to her Dr. Donn Pierini last time she saw her, however her dr told her to keep an eye on it. She is not on her cycle now, and is denying vaginal bleeding.  She is currently a smoker. She denies pain.    OB History   Grav Para Term Preterm Abortions TAB SAB Ect Mult Living   4 3 3  1 1    3       Past Medical History  Diagnosis Date  . Medical history non-contributory     Past Surgical History  Procedure Laterality Date  . Appendectomy    . Hernia repair    . Tubal ligation      Family History  Problem Relation Age of Onset  . Heart disease Mother   . Hyperlipidemia Mother   . Hypertension Mother   . Diabetes Father   . Heart disease Father   . Hyperlipidemia Father   . Hypertension Father   . Cancer Father   . Stroke Father   . Asthma Maternal Aunt     History  Substance Use Topics  . Smoking status: Current Every Day Smoker -- 1.00 packs/day    Types: Cigars  . Smokeless tobacco: Never Used  . Alcohol Use: No     Comment: Pt only has a drink a few times a year.     Allergies: No Known Allergies  No prescriptions prior to admission   Results for orders placed during the hospital encounter of 04/03/13 (from the past 24 hour(s))  URINALYSIS, ROUTINE W REFLEX MICROSCOPIC     Status: None   Collection Time    04/03/13 10:04 AM      Result Value Range   Color, Urine YELLOW  YELLOW   APPearance CLEAR  CLEAR   Specific Gravity, Urine 1.025  1.005 - 1.030   pH 6.0  5.0 - 8.0   Glucose, UA NEGATIVE  NEGATIVE mg/dL   Hgb urine dipstick NEGATIVE  NEGATIVE   Bilirubin Urine NEGATIVE  NEGATIVE   Ketones, ur NEGATIVE  NEGATIVE mg/dL   Protein, ur NEGATIVE  NEGATIVE mg/dL   Urobilinogen, UA 0.2  0.0 - 1.0 mg/dL   Nitrite NEGATIVE  NEGATIVE   Leukocytes, UA NEGATIVE  NEGATIVE  CBC     Status: Abnormal   Collection Time    04/03/13 10:27 AM      Result Value Range   WBC 5.8  4.0 - 10.5 K/uL   RBC 3.68 (*) 3.87 - 5.11 MIL/uL   Hemoglobin 9.6 (*) 12.0 - 15.0 g/dL   HCT 13.0 (*) 86.5 - 78.4 %   MCV 80.4  78.0 - 100.0 fL   MCH 26.1  26.0 - 34.0 pg   MCHC 32.4  30.0 - 36.0 g/dL   RDW 69.6 (*) 29.5 - 28.4 %   Platelets 305  150 - 400 K/uL  WET PREP, GENITAL     Status: Abnormal   Collection Time    04/03/13 11:30 AM  Result Value Range   Yeast Wet Prep HPF POC NONE SEEN  NONE SEEN   Trich, Wet Prep MANY (*) NONE SEEN   Clue Cells Wet Prep HPF POC MODERATE (*) NONE SEEN   WBC, Wet Prep HPF POC MODERATE (*) NONE SEEN    Review of Systems  Constitutional: Positive for chills and weight loss. Negative for fever.       Pt has had a lot of stress due to deaths in the family. Pt has lost 6 lbs in the last year.   Cardiovascular: Negative for chest pain.  Gastrointestinal: Negative for nausea, vomiting, abdominal pain and blood in stool.  Genitourinary: Negative for dysuria, urgency, frequency and hematuria.       No vaginal discharge. No vaginal bleeding currently.  No dysuria.   Neurological: Positive for weakness.   Physical Exam   Blood pressure 133/91, pulse 69, temperature 98 F (36.7 C), temperature source Oral, resp. rate 16, height 5\' 2"  (1.575 m), weight 49.669 kg (109 lb 8 oz), last menstrual period 03/26/2013.  Physical Exam  Constitutional: She is oriented to person, place, and time. She appears well-developed and well-nourished. No distress.  HENT:  Head: Normocephalic.  Eyes: Pupils are equal, round, and reactive to light.  Neck: Neck supple.  Respiratory: Effort normal.  GI:  Soft. She exhibits no distension and no mass. There is no tenderness. There is no rebound and no guarding.  Genitourinary:  Speculum exam: Vagina - Small amount of creamy discharge, no odor Cervix - No contact bleeding Bimanual exam: Cervix closed, no CMT  Uterus non tender, normal size Adnexa non tender, no masses bilaterally GC/Chlam, wet prep done Chaperone present for exam.   Musculoskeletal: Normal range of motion.  Neurological: She is alert and oriented to person, place, and time.  Skin: Skin is warm. She is not diaphoretic.  Psychiatric: Her behavior is normal.    MAU Course  Procedures None  MDM Wet prep GC/Chlamydia CBC   Assessment and Plan   A:  1. BV (bacterial vaginosis)   2. Trichomonas   3. Abnormal vaginal bleeding     P: Discharge home RX: iron        Flagyl times 7 days  STI precautions dicussed. Partner will need to be treated Referral to the clinic made; the clinic will call you to schedule an appointment Return to MAU as needed if symptoms worsen Bleeding precautions discussed   Jordan Hansen Jordan Pina, NP  04/03/2013, 12:16 PM

## 2013-04-03 NOTE — MAU Note (Signed)
Patient presents with complaint of menstrual cycles twice monthly X 6 days for 3 months.

## 2013-04-05 LAB — GC/CHLAMYDIA PROBE AMP
CT Probe RNA: NEGATIVE
GC Probe RNA: NEGATIVE

## 2013-05-05 ENCOUNTER — Encounter: Payer: Medicaid Other | Admitting: Obstetrics & Gynecology

## 2013-05-21 ENCOUNTER — Ambulatory Visit: Payer: Medicaid Other | Admitting: Obstetrics & Gynecology

## 2013-07-01 ENCOUNTER — Other Ambulatory Visit (HOSPITAL_COMMUNITY)
Admission: RE | Admit: 2013-07-01 | Discharge: 2013-07-01 | Disposition: A | Discharge status: Home / Self Care | Payer: Medicaid Other | Source: Ambulatory Visit | Attending: Family Medicine | Admitting: Family Medicine

## 2013-07-01 ENCOUNTER — Other Ambulatory Visit: Payer: Medicaid Other | Admitting: Family Medicine

## 2013-07-01 DIAGNOSIS — N760 Acute vaginitis: Secondary | ICD-10-CM | POA: Insufficient documentation

## 2013-07-01 DIAGNOSIS — Z113 Encounter for screening for infections with a predominantly sexual mode of transmission: Secondary | ICD-10-CM | POA: Insufficient documentation

## 2013-07-07 LAB — URINE CYTOLOGY ANCILLARY ONLY
BACTERIAL VAGINITIS: POSITIVE — AB
Candida vaginitis: NEGATIVE
Chlamydia: NEGATIVE
NEISSERIA GONORRHEA: NEGATIVE
TRICH (WINDOWPATH): NEGATIVE

## 2013-11-01 ENCOUNTER — Emergency Department (HOSPITAL_COMMUNITY)
Admission: EM | Admit: 2013-11-01 | Discharge: 2013-11-01 | Discharge status: Against Medical Advice | Payer: Medicaid Other | Attending: Emergency Medicine | Admitting: Emergency Medicine

## 2013-11-01 ENCOUNTER — Encounter (HOSPITAL_COMMUNITY): Payer: Self-pay | Admitting: Emergency Medicine

## 2013-11-01 DIAGNOSIS — R5383 Other fatigue: Principal | ICD-10-CM

## 2013-11-01 DIAGNOSIS — R5381 Other malaise: Secondary | ICD-10-CM | POA: Diagnosis present

## 2013-11-01 DIAGNOSIS — F17200 Nicotine dependence, unspecified, uncomplicated: Secondary | ICD-10-CM | POA: Diagnosis not present

## 2013-11-01 NOTE — ED Notes (Signed)
Pt turned her pager into registration staff and told them she was leaving

## 2013-11-01 NOTE — ED Notes (Signed)
Pt. Stated, My menstruation came on at 1200 today and from then on my body has felt like it was tingling all over.

## 2014-01-06 ENCOUNTER — Emergency Department (HOSPITAL_COMMUNITY)
Admission: EM | Admit: 2014-01-06 | Discharge: 2014-01-06 | Disposition: A | Discharge status: Home / Self Care | Payer: Medicaid Other | Source: Home / Self Care

## 2014-01-21 ENCOUNTER — Inpatient Hospital Stay (HOSPITAL_COMMUNITY)
Admission: AD | Admit: 2014-01-21 | Discharge: 2014-01-21 | Discharge status: Against Medical Advice | Payer: Medicaid Other | Source: Ambulatory Visit | Attending: Family Medicine | Admitting: Family Medicine

## 2014-01-21 DIAGNOSIS — Z3202 Encounter for pregnancy test, result negative: Secondary | ICD-10-CM | POA: Insufficient documentation

## 2014-01-21 DIAGNOSIS — N644 Mastodynia: Secondary | ICD-10-CM | POA: Insufficient documentation

## 2014-01-21 DIAGNOSIS — R112 Nausea with vomiting, unspecified: Secondary | ICD-10-CM | POA: Insufficient documentation

## 2014-01-21 LAB — URINALYSIS, ROUTINE W REFLEX MICROSCOPIC
Bilirubin Urine: NEGATIVE
Glucose, UA: NEGATIVE mg/dL
Ketones, ur: NEGATIVE mg/dL
LEUKOCYTES UA: NEGATIVE
NITRITE: NEGATIVE
Protein, ur: NEGATIVE mg/dL
SPECIFIC GRAVITY, URINE: 1.02 (ref 1.005–1.030)
UROBILINOGEN UA: 0.2 mg/dL (ref 0.0–1.0)
pH: 6 (ref 5.0–8.0)

## 2014-01-21 LAB — URINE MICROSCOPIC-ADD ON

## 2014-01-21 LAB — POCT PREGNANCY, URINE: Preg Test, Ur: NEGATIVE

## 2014-01-21 NOTE — MAU Note (Signed)
Patient states she has had a negative pregnancy test. States she had her tubes tied about 10 years ago. Has had enlargement and pain in both breasts, nausea with vomiting. States she has continued to have dark spotting since her last period on 10-5.

## 2014-01-22 ENCOUNTER — Encounter (HOSPITAL_COMMUNITY): Payer: Self-pay | Admitting: *Deleted

## 2014-01-22 ENCOUNTER — Inpatient Hospital Stay (HOSPITAL_COMMUNITY)
Admission: AD | Admit: 2014-01-22 | Discharge: 2014-01-22 | Disposition: A | Discharge status: Home / Self Care | Payer: Self-pay | Source: Ambulatory Visit | Attending: Obstetrics and Gynecology | Admitting: Obstetrics and Gynecology

## 2014-01-22 DIAGNOSIS — N939 Abnormal uterine and vaginal bleeding, unspecified: Secondary | ICD-10-CM | POA: Insufficient documentation

## 2014-01-22 DIAGNOSIS — N644 Mastodynia: Secondary | ICD-10-CM

## 2014-01-22 DIAGNOSIS — F17210 Nicotine dependence, cigarettes, uncomplicated: Secondary | ICD-10-CM | POA: Insufficient documentation

## 2014-01-22 DIAGNOSIS — N923 Ovulation bleeding: Secondary | ICD-10-CM

## 2014-01-22 DIAGNOSIS — R1031 Right lower quadrant pain: Secondary | ICD-10-CM | POA: Insufficient documentation

## 2014-01-22 LAB — CBC
HEMATOCRIT: 31.4 % — AB (ref 36.0–46.0)
Hemoglobin: 10.4 g/dL — ABNORMAL LOW (ref 12.0–15.0)
MCH: 27 pg (ref 26.0–34.0)
MCHC: 33.1 g/dL (ref 30.0–36.0)
MCV: 81.6 fL (ref 78.0–100.0)
Platelets: 346 10*3/uL (ref 150–400)
RBC: 3.85 MIL/uL — ABNORMAL LOW (ref 3.87–5.11)
RDW: 18.7 % — ABNORMAL HIGH (ref 11.5–15.5)
WBC: 7.3 10*3/uL (ref 4.0–10.5)

## 2014-01-22 LAB — WET PREP, GENITAL
TRICH WET PREP: NONE SEEN
Yeast Wet Prep HPF POC: NONE SEEN

## 2014-01-22 LAB — POCT PREGNANCY, URINE: Preg Test, Ur: NEGATIVE

## 2014-01-22 LAB — HCG, QUANTITATIVE, PREGNANCY

## 2014-01-22 LAB — HIV ANTIBODY (ROUTINE TESTING W REFLEX): HIV 1&2 Ab, 4th Generation: NONREACTIVE

## 2014-01-22 LAB — ABO/RH: ABO/RH(D): O POS

## 2014-01-22 LAB — RPR

## 2014-01-22 NOTE — MAU Provider Note (Signed)
History     CSN: [id number]  Arrival date and time: 01/22/14 [id number]   First Provider Initiated Contact with Patient 01/22/14 1109      Chief Complaint  Patient presents with  . Abdominal Pain  . Breast Pain   HPI Jordan Leblanc 38 y.o. Comes to MAU thinking she may be pregnant and as she has had a tubal ligation, thinks it may be in her tube.  Complains of RLQ pain for several days.  Has been up at night frequently needing to urinate.  LMP was 01-10-14 until 01-16-14 and then has been having spotting continuously since then.  States her breasts are very tender and have been larger for one month.  Her urine pregnancy test is negative (was here yesterday but was unable to wait to be seen).  States her pregnancies never show up on a urine test.  OB History   Grav Para Term Preterm Abortions TAB SAB Ect Mult Living   4 3 3  1 1    3       Past Medical History  Diagnosis Date  . Medical history non-contributory     Past Surgical History  Procedure Laterality Date  . Appendectomy    . Hernia repair    . Tubal ligation      Family History  Problem Relation Age of Onset  . Heart disease Mother   . Hyperlipidemia Mother   . Hypertension Mother   . Diabetes Father   . Heart disease Father   . Hyperlipidemia Father   . Hypertension Father   . Cancer Father   . Stroke Father   . Asthma Maternal Aunt     History  Substance Use Topics  . Smoking status: Current Every Day Smoker -- 1.00 packs/day    Types: Cigars  . Smokeless tobacco: Never Used  . Alcohol Use: No     Comment: Pt only has a drink a few times a year.     Allergies: No Known Allergies  No prescriptions prior to admission    Review of Systems  Constitutional: Negative for fever.  Gastrointestinal: Positive for abdominal pain and constipation. Negative for diarrhea.       Last BM was today.  Genitourinary:       No vaginal discharge. Vaginal bleeding. No dysuria. Urinary frequency. Bilateral breast  tenderness.   Physical Exam   Blood pressure 102/85, pulse 82, temperature 98.1 F (36.7 C), temperature source Oral, resp. rate 16, height 5\' 2"  (1.575 m), weight 111 lb 6 oz (50.519 kg), last menstrual period 01/09/2014.  Physical Exam  Nursing note and vitals reviewed. Constitutional: She is oriented to person, place, and time. She appears well-developed and well-nourished.  HENT:  Head: Normocephalic.  Eyes: EOM are normal.  Neck: Neck supple.  GI: Soft. There is tenderness. There is guarding. There is no rebound.  RLQ tenderness.  Scar from appendectomy noted.  Genitourinary:  Breast pain bilaterally.  Pain is severe and client unable to tolerate a typical breast exam.  With very light palpation, client is experiencing pain.  No masses palpated although the exam was not a thorough exam.  No nipple discharge bilaterally. Speculum exam: Vagina - Mod amount of dark blood Cervix - No contact bleeding Bimanual exam: Cervix closed Uterus non tender, normal size Adnexa non tender, no masses bilaterally GC/Chlam, wet prep done Chaperone present for exam.  Musculoskeletal: Normal range of motion.  Neurological: She is alert and oriented to person, place, and time.  Skin:  Skin is warm and dry.  Psychiatric: She has a normal mood and affect.    MAU Course  Procedures  MDM Results for orders placed during the hospital encounter of 01/22/14 (from the past 24 hour(s))  POCT PREGNANCY, URINE     Status: None   Collection Time    01/22/14 10:44 AM      Result Value Ref Range   Preg Test, Ur NEGATIVE  NEGATIVE  CBC     Status: Abnormal   Collection Time    01/22/14 11:22 AM      Result Value Ref Range   WBC 7.3  4.0 - 10.5 K/uL   RBC 3.85 (*) 3.87 - 5.11 MIL/uL   Hemoglobin 10.4 (*) 12.0 - 15.0 g/dL   HCT 40.931.4 (*) 81.136.0 - 91.446.0 %   MCV 81.6  78.0 - 100.0 fL   MCH 27.0  26.0 - 34.0 pg   MCHC 33.1  30.0 - 36.0 g/dL   RDW 78.218.7 (*) 95.611.5 - 21.315.5 %   Platelets 346  150 - 400 K/uL   HCG, QUANTITATIVE, PREGNANCY     Status: None   Collection Time    01/22/14 11:35 AM      Result Value Ref Range   hCG, Beta Chain, Quant, S <1  <5 mIU/mL  ABO/RH     Status: None   Collection Time    01/22/14 11:35 AM      Result Value Ref Range   ABO/RH(D) O POS    WET PREP, GENITAL     Status: Abnormal   Collection Time    01/22/14 11:50 AM      Result Value Ref Range   Yeast Wet Prep HPF POC NONE SEEN  NONE SEEN   Trich, Wet Prep NONE SEEN  NONE SEEN   Clue Cells Wet Prep HPF POC MANY (*) NONE SEEN   WBC, Wet Prep HPF POC FEW (*) NONE SEEN   Discussed findings with client.  One options is to do a pelvic ultrasound.  Client declines that today.  Would rather go home.  Client reports the breast pain is most bothersome symptom for her.  Will take Ibuprofen at home for her breast pain and will follow up with her doctor if the abdominal pain and the bleeding continues.    Assessment and Plan  Abdominal pain  - unknown cause Breast pain Extended vaginal bleeding  Plan Cultures pending. Use OTC Ibuprofen to help with breast pain and abdominal pain. Follow up with your PCP - Dr. Parke SimmersBland - for further evaluation. You are not pregnant.  BURLESON,TERRI 01/22/2014, 11:23 AM

## 2014-01-22 NOTE — Discharge Instructions (Signed)
You can take over the counter ibuprofen by the package directions for your pain.

## 2014-01-22 NOTE — MAU Note (Signed)
Pt states was here yesterday and left because unit was very busy. Here for breast pain/tenderness, and having lower abd pain, cramping. Feels bloated. LMP-01/09/2014, then has been spotting dark then pink blood for past week.

## 2014-01-24 LAB — GC/CHLAMYDIA PROBE AMP
CT PROBE, AMP APTIMA: NEGATIVE
GC Probe RNA: NEGATIVE

## 2014-01-26 NOTE — MAU Provider Note (Signed)
`````

## 2014-02-07 ENCOUNTER — Encounter (HOSPITAL_COMMUNITY): Payer: Self-pay | Admitting: *Deleted

## 2014-11-27 ENCOUNTER — Emergency Department (HOSPITAL_COMMUNITY): Payer: Medicaid Other

## 2014-11-27 ENCOUNTER — Encounter (HOSPITAL_COMMUNITY): Payer: Self-pay | Admitting: Emergency Medicine

## 2014-11-27 ENCOUNTER — Emergency Department (HOSPITAL_COMMUNITY)
Admission: EM | Admit: 2014-11-27 | Discharge: 2014-11-27 | Disposition: A | Discharge status: Home / Self Care | Payer: Medicaid Other | Attending: Emergency Medicine | Admitting: Emergency Medicine

## 2014-11-27 DIAGNOSIS — A5901 Trichomonal vulvovaginitis: Secondary | ICD-10-CM

## 2014-11-27 DIAGNOSIS — Z3202 Encounter for pregnancy test, result negative: Secondary | ICD-10-CM | POA: Insufficient documentation

## 2014-11-27 DIAGNOSIS — R109 Unspecified abdominal pain: Secondary | ICD-10-CM

## 2014-11-27 DIAGNOSIS — Z720 Tobacco use: Secondary | ICD-10-CM | POA: Insufficient documentation

## 2014-11-27 LAB — URINALYSIS, ROUTINE W REFLEX MICROSCOPIC
BILIRUBIN URINE: NEGATIVE
GLUCOSE, UA: NEGATIVE mg/dL
Hgb urine dipstick: NEGATIVE
KETONES UR: NEGATIVE mg/dL
Nitrite: NEGATIVE
PH: 5.5 (ref 5.0–8.0)
Protein, ur: NEGATIVE mg/dL
Specific Gravity, Urine: 1.023 (ref 1.005–1.030)
Urobilinogen, UA: 0.2 mg/dL (ref 0.0–1.0)

## 2014-11-27 LAB — BASIC METABOLIC PANEL
Anion gap: 8 (ref 5–15)
BUN: 8 mg/dL (ref 6–20)
CALCIUM: 9 mg/dL (ref 8.9–10.3)
CO2: 24 mmol/L (ref 22–32)
CREATININE: 0.91 mg/dL (ref 0.44–1.00)
Chloride: 106 mmol/L (ref 101–111)
GFR calc Af Amer: >60 mL/min (ref >60)
Glucose, Bld: 94 mg/dL (ref 65–99)
Potassium: 3.4 mmol/L — ABNORMAL LOW (ref 3.5–5.1)
Sodium: 138 mmol/L (ref 135–145)

## 2014-11-27 LAB — WET PREP, GENITAL
Clue Cells Wet Prep HPF POC: NONE SEEN
Yeast Wet Prep HPF POC: NONE SEEN

## 2014-11-27 LAB — CBC
HEMATOCRIT: 35.9 % — AB (ref 36.0–46.0)
Hemoglobin: 11.9 g/dL — ABNORMAL LOW (ref 12.0–15.0)
MCH: 27.4 pg (ref 26.0–34.0)
MCHC: 33.1 g/dL (ref 30.0–36.0)
MCV: 82.7 fL (ref 78.0–100.0)
PLATELETS: 303 10*3/uL (ref 150–400)
RBC: 4.34 MIL/uL (ref 3.87–5.11)
RDW: 20.2 % — AB (ref 11.5–15.5)
WBC: 6.6 10*3/uL (ref 4.0–10.5)

## 2014-11-27 LAB — URINE MICROSCOPIC-ADD ON

## 2014-11-27 LAB — POC URINE PREG, ED: Preg Test, Ur: NEGATIVE

## 2014-11-27 MED ORDER — IBUPROFEN 200 MG PO TABS
600.0000 mg | ORAL_TABLET | Freq: Once | ORAL | Status: AC
  Administered 2014-11-27: 600 mg via ORAL
  Filled 2014-11-27: qty 3

## 2014-11-27 MED ORDER — AZITHROMYCIN 250 MG PO TABS
1000.0000 mg | ORAL_TABLET | Freq: Once | ORAL | Status: AC
  Administered 2014-11-27: 1000 mg via ORAL
  Filled 2014-11-27: qty 4

## 2014-11-27 MED ORDER — ONDANSETRON 4 MG PO TBDP
8.0000 mg | ORAL_TABLET | Freq: Once | ORAL | Status: AC
  Administered 2014-11-27: 8 mg via ORAL
  Filled 2014-11-27: qty 2

## 2014-11-27 MED ORDER — IBUPROFEN 600 MG PO TABS: 600.0000 mg | ORAL_TABLET | Freq: Three times a day (TID) | ORAL | Status: DC | PRN

## 2014-11-27 MED ORDER — CEFTRIAXONE SODIUM 250 MG IJ SOLR
250.0000 mg | Freq: Once | INTRAMUSCULAR | Status: AC
  Administered 2014-11-27: 250 mg via INTRAMUSCULAR
  Filled 2014-11-27: qty 250

## 2014-11-27 MED ORDER — POTASSIUM CHLORIDE CRYS ER 20 MEQ PO TBCR
40.0000 meq | EXTENDED_RELEASE_TABLET | Freq: Once | ORAL | Status: AC
  Administered 2014-11-27: 40 meq via ORAL
  Filled 2014-11-27: qty 2

## 2014-11-27 MED ORDER — METRONIDAZOLE 500 MG PO TABS
2000.0000 mg | ORAL_TABLET | Freq: Once | ORAL | Status: AC
  Administered 2014-11-27: 2000 mg via ORAL
  Filled 2014-11-27: qty 4

## 2014-11-27 NOTE — ED Notes (Signed)
Pt reports she has vaginal discharge and left flank pain for approx 1.5 wk.  Discharge described as thick, white/beige.

## 2014-11-27 NOTE — ED Provider Notes (Signed)
CSN: [id number]     Arrival date & time 11/27/14  0756 History   First MD Initiated Contact with Patient 11/27/14 0800     Chief Complaint  Patient presents with  . Abdominal Pain     (Consider location/radiation/quality/duration/timing/severity/associated sxs/prior Treatment) HPI  39 year old female presents with left flank pain for the past one and half weeks. Has been intermittent. It mostly comes and goes without any specific etiology but occasionally when she is bending over or at work the pain seems to be worse. Occasionally radiates down into her abdomen. The patient denies any dysuria, hematuria. Has been having vaginal discharge with a foul odor for the last 3 weeks. Specifically denies a fishy odor which she has had in the past. Last menstrual cycle finished 2 weeks ago. Denies any fevers or chills. No vomiting or nausea. No diarrhea. Has not taken anything for the pain.  Past Medical History  Diagnosis Date  . Medical history non-contributory    Past Surgical History  Procedure Laterality Date  . Appendectomy    . Hernia repair    . Tubal ligation     Family History  Problem Relation Age of Onset  . Heart disease Mother   . Hyperlipidemia Mother   . Hypertension Mother   . Diabetes Father   . Heart disease Father   . Hyperlipidemia Father   . Hypertension Father   . Cancer Father   . Stroke Father   . Asthma Maternal Aunt    Social History  Substance Use Topics  . Smoking status: Current Every Day Smoker -- 0.50 packs/day    Types: Cigars  . Smokeless tobacco: Never Used  . Alcohol Use: No     Comment: Pt only has a drink a few times a year.    OB History    Gravida Para Term Preterm AB TAB SAB Ectopic Multiple Living   Review of Systems  Constitutional: Negative for fever.  Gastrointestinal: Positive for abdominal pain. Negative for nausea and vomiting.  Genitourinary: Positive for vaginal discharge. Negative for dysuria, hematuria,  vaginal bleeding and menstrual problem.  All other systems reviewed and are negative.     Allergies  Review of patient's allergies indicates no known allergies.  Home Medications   Prior to Admission medications   Not on File   BP 112/80 mmHg  Pulse 83  Temp(Src) 99.1 F (37.3 C) (Oral)  Resp 16  Ht  (1.626 m)  Wt 119 lb (53.978 kg)  BMI 20.42 kg/m2  SpO2 100%  LMP 11/13/2014 Physical Exam  Constitutional: She is oriented to person, place, and time. She appears well-developed and well-nourished. No distress.  HENT:  Head: Normocephalic and atraumatic.  Right Ear: External ear normal.  Left Ear: External ear normal.  Nose: Nose normal.  Eyes: Right eye exhibits no discharge. Left eye exhibits no discharge.  Cardiovascular: Normal rate, regular rhythm and normal heart sounds.   Pulmonary/Chest: Effort normal and breath sounds normal.  Abdominal: Soft. She exhibits no distension. There is tenderness (mild) in the suprapubic area. There is no CVA tenderness.    Genitourinary: Uterus is not enlarged and not tender. Cervix exhibits no motion tenderness and no friability. Right adnexum displays no mass and no tenderness. Left adnexum displays no mass and no tenderness. Vaginal discharge found.  Neurological: She is alert and oriented to person, place, and time.  Skin: Skin is warm and dry.  She is not diaphoretic.  Nursing note and vitals reviewed.   ED Course  Procedures (including critical care time) Labs Review Labs Reviewed  WET PREP, GENITAL - Abnormal; Notable for the following:    Trich, Wet Prep MANY (*)    WBC, Wet Prep HPF POC MANY (*)    All other components within normal limits  CBC - Abnormal; Notable for the following:    Hemoglobin 11.9 (*)    HCT 35.9 (*)    RDW 20.2 (*)    All other components within normal limits  BASIC METABOLIC PANEL - Abnormal; Notable for the following:    Potassium 3.4 (*)    All other components within normal limits    URINALYSIS, ROUTINE W REFLEX MICROSCOPIC (NOT AT [facility]) - Abnormal; Notable for the following:    Leukocytes, UA SMALL (*)    All other components within normal limits  URINE MICROSCOPIC-ADD ON  POC URINE PREG, ED  GC/CHLAMYDIA PROBE AMP ([place]) NOT AT [facility]    Imaging Review US Renal  11/27/2014   CLINICAL DATA:  LEFT flank pain.  Initial encounter.  EXAM: RENAL / URINARY TRACT ULTRASOUND COMPLETE  COMPARISON:  None.  FINDINGS: Right Kidney:  Length: 10.0 cm. Echogenicity within normal limits. No mass or hydronephrosis visualized.  Left Kidney:  Length: 10.1 cm. Echogenicity within normal limits. No mass or hydronephrosis visualized.  Bladder:  Appears normal for degree of bladder distention.  IMPRESSION: Normal renal ultrasound.   Electronically Signed   By: Andreas Newport M.D.   On: 11/27/2014 10:10   I have personally reviewed and evaluated these images and lab results as part of my medical decision-making.   EKG Interpretation None      MDM   Final diagnoses:  Left flank pain  Trichomonal vulvovaginitis    I believe patient's left flank pain is most likely musculoskeletal. It worsens with types of movement and is mildly reproducible. Low suspicion for kidney stone given well appearance, mild pain, no hematuria, and no obstructive signs on ultrasound. Mild lower abdominal tenderness is likely related to her vaginitis from trichomonas. After discussion with patient she agrees to get Rocephin and azithromycin as well given past history of chlamydia. No evidence of PID. I do not feel she needs a CT of her abdomen at this time. Will treat with NSAIDs and follow-up with PCP. Discussed return precautions.    Pricilla Loveless, MD 11/27/14 1038

## 2014-11-28 LAB — GC/CHLAMYDIA PROBE AMP (~~LOC~~) NOT AT ARMC
Chlamydia: NEGATIVE
NEISSERIA GONORRHEA: NEGATIVE

## 2015-01-22 ENCOUNTER — Emergency Department (HOSPITAL_COMMUNITY)
Admission: EM | Admit: 2015-01-22 | Discharge: 2015-01-22 | Disposition: A | Discharge status: Home / Self Care | Payer: 59 | Attending: Emergency Medicine | Admitting: Emergency Medicine

## 2015-01-22 ENCOUNTER — Encounter (HOSPITAL_COMMUNITY): Payer: Self-pay | Admitting: Emergency Medicine

## 2015-01-22 DIAGNOSIS — N898 Other specified noninflammatory disorders of vagina: Secondary | ICD-10-CM | POA: Diagnosis not present

## 2015-01-22 DIAGNOSIS — Z8619 Personal history of other infectious and parasitic diseases: Secondary | ICD-10-CM | POA: Insufficient documentation

## 2015-01-22 DIAGNOSIS — Z720 Tobacco use: Secondary | ICD-10-CM | POA: Insufficient documentation

## 2015-01-22 DIAGNOSIS — Z3202 Encounter for pregnancy test, result negative: Secondary | ICD-10-CM | POA: Diagnosis not present

## 2015-01-22 HISTORY — DX: Chlamydial infection, unspecified: A74.9

## 2015-01-22 LAB — WET PREP, GENITAL
Trich, Wet Prep: NONE SEEN
Yeast Wet Prep HPF POC: NONE SEEN

## 2015-01-22 LAB — RPR: RPR Ser Ql: NONREACTIVE

## 2015-01-22 LAB — HIV ANTIBODY (ROUTINE TESTING W REFLEX): HIV Screen 4th Generation wRfx: NONREACTIVE

## 2015-01-22 LAB — POC URINE PREG, ED: PREG TEST UR: NEGATIVE

## 2015-01-22 MED ORDER — CEFTRIAXONE SODIUM 250 MG IJ SOLR
250.0000 mg | Freq: Once | INTRAMUSCULAR | Status: AC
  Administered 2015-01-22: 250 mg via INTRAMUSCULAR
  Filled 2015-01-22: qty 250

## 2015-01-22 MED ORDER — LIDOCAINE HCL (PF) 1 % IJ SOLN
5.0000 mL | Freq: Once | INTRAMUSCULAR | Status: AC
  Administered 2015-01-22: 5 mL
  Filled 2015-01-22: qty 5

## 2015-01-22 MED ORDER — ONDANSETRON 4 MG PO TBDP
4.0000 mg | ORAL_TABLET | Freq: Once | ORAL | Status: AC
  Administered 2015-01-22: 4 mg via ORAL
  Filled 2015-01-22: qty 1

## 2015-01-22 MED ORDER — METRONIDAZOLE 500 MG PO TABS: 500.0000 mg | ORAL_TABLET | Freq: Two times a day (BID) | ORAL | Status: DC

## 2015-01-22 MED ORDER — AZITHROMYCIN 250 MG PO TABS
1000.0000 mg | ORAL_TABLET | Freq: Once | ORAL | Status: AC
  Administered 2015-01-22: 1000 mg via ORAL
  Filled 2015-01-22: qty 4

## 2015-01-22 NOTE — ED Notes (Signed)
Vag bleeding for 3 weeks, stopped yesterday, but states has a discharge now. abd pain, low right side. Has been treated for chlamydia in past.

## 2015-01-22 NOTE — ED Provider Notes (Signed)
CSN: [id number]     Arrival date & time 01/22/15  0710 History   First MD Initiated Contact with Patient 01/22/15 [phone]     Chief Complaint  Patient presents with  . Vaginal Discharge  . Vaginal Bleeding    HPI    39 year old female presents today with vaginal discharge. Patient reports that over the last 3 weeks she's had intermittent vaginal bleeding, stopped yesterday. No bleeding today, noticed a white vaginal discharge, nonodorous. She reports she is sexually active, one partner and doesn't use protection. Not currently on birth control, tubal ligation. Patient denies any nausea, vomiting, fever, chills, abdominal pain. History of STDs in the past. Requesting prophylactic treatment.   Past Medical History  Diagnosis Date  . Medical history non-contributory   . Chlamydia    Past Surgical History  Procedure Laterality Date  . Appendectomy    . Hernia repair    . Tubal ligation     Family History  Problem Relation Age of Onset  . Heart disease Mother   . Hyperlipidemia Mother   . Hypertension Mother   . Diabetes Father   . Heart disease Father   . Hyperlipidemia Father   . Hypertension Father   . Cancer Father   . Stroke Father   . Asthma Maternal Aunt    Social History  Substance Use Topics  . Smoking status: Current Every Day Smoker -- 0.50 packs/day    Types: Cigars  . Smokeless tobacco: Never Used  . Alcohol Use: No     Comment: Pt only has a drink a few times a year.    OB History    Gravida Para Term Preterm AB TAB SAB Ectopic Multiple Living   4 3 3  1 1    3      Review of Systems  All other systems reviewed and are negative.   Allergies  Review of patient's allergies indicates no known allergies.  Home Medications   Prior to Admission medications   Medication Sig Start Date End Date Taking? Authorizing Provider  ibuprofen (ADVIL,MOTRIN) 600 MG tablet Take 1 tablet (600 mg total) by mouth every 8 (eight) hours as needed for mild pain, moderate  pain or cramping. Patient not taking: Reported on 01/22/2015 11/27/14   Pricilla LovelessScott Goldston, MD  metroNIDAZOLE (FLAGYL) 500 MG tablet Take 1 tablet (500 mg total) by mouth 2 (two) times daily. 01/22/15   Starlett Pehrson, PA-C   BP 107/79 mmHg  Pulse 74  Temp(Src) 98.2 F (36.8 C) (Oral)  Resp 16  Ht 5\' 4"  (1.626 m)  Wt 117 lb (53.071 kg)  BMI 20.07 kg/m2  SpO2 100%  LMP 01/01/2015 (Exact Date)    Physical Exam  Constitutional: She is oriented to person, place, and time. She appears well-developed and well-nourished.  HENT:  Head: Normocephalic and atraumatic.  Eyes: Conjunctivae are normal. Pupils are equal, round, and reactive to light. Right eye exhibits no discharge. Left eye exhibits no discharge. No scleral icterus.  Neck: Normal range of motion. No JVD present. No tracheal deviation present.  Cardiovascular: Normal rate, regular rhythm, normal heart sounds and intact distal pulses.  Exam reveals no gallop and no friction rub.   No murmur heard. Pulmonary/Chest: Effort normal. No stridor.  Abdominal: Soft. Bowel sounds are normal. She exhibits no distension and no mass. There is no tenderness. There is no rebound and no guarding.  Genitourinary: No tenderness in the vagina. Vaginal discharge found.  Musculoskeletal: Normal range of motion. She exhibits no edema  or tenderness.  Neurological: She is alert and oriented to person, place, and time. Coordination normal.  Skin: Skin is warm and dry.  Psychiatric: She has a normal mood and affect. Her behavior is normal. Judgment and thought content normal.  Nursing note and vitals reviewed.     ED Course  Procedures (including critical care time) Labs Review Labs Reviewed  WET PREP, GENITAL - Abnormal; Notable for the following:    Clue Cells Wet Prep HPF POC FEW (*)    WBC, Wet Prep HPF POC FEW (*)    All other components within normal limits  RPR  HIV ANTIBODY (ROUTINE TESTING)  POC URINE PREG, ED  GC/CHLAMYDIA PROBE AMP ([facility]) NOT AT [facility]    Imaging Review No results found. I have personally reviewed and evaluated these images and lab results as part of my medical decision-making.   EKG Interpretation None      MDM   Final diagnoses:  Vaginal discharge    Labs:   Imaging:  Consults:  Therapeutics: Ceftriaxone, Rocephin, Zofran  Discharge Meds:  Metronidazole  Assessment/Plan:  Patient since with vaginal discharge, no abdominal pain, signs reassuring. Patient treated here for prophylactic gonorrhea Chlamydia, home with metronidazole for BV. Patient instructed follow-up with women's health for further evaluation and management of her previous vaginal bleeding. No other concerns today. Patient verbalized understanding and agreement for today's plan, should her follow-up evaluation.         Eyvonne Mechanic, PA-C 01/22/15 1138  Margarita Grizzle, MD 01/23/15 1322

## 2015-01-23 LAB — GC/CHLAMYDIA PROBE AMP (~~LOC~~) NOT AT ARMC
Chlamydia: NEGATIVE
Neisseria Gonorrhea: NEGATIVE

## 2015-03-26 ENCOUNTER — Encounter (HOSPITAL_COMMUNITY): Payer: Self-pay | Admitting: *Deleted

## 2015-03-26 ENCOUNTER — Emergency Department (HOSPITAL_COMMUNITY)
Admission: EM | Admit: 2015-03-26 | Discharge: 2015-03-26 | Disposition: A | Discharge status: Home / Self Care | Payer: 59 | Attending: Emergency Medicine | Admitting: Emergency Medicine

## 2015-03-26 DIAGNOSIS — Z3202 Encounter for pregnancy test, result negative: Secondary | ICD-10-CM | POA: Diagnosis not present

## 2015-03-26 DIAGNOSIS — A5901 Trichomonal vulvovaginitis: Secondary | ICD-10-CM | POA: Insufficient documentation

## 2015-03-26 DIAGNOSIS — Z79899 Other long term (current) drug therapy: Secondary | ICD-10-CM | POA: Diagnosis not present

## 2015-03-26 DIAGNOSIS — N898 Other specified noninflammatory disorders of vagina: Secondary | ICD-10-CM

## 2015-03-26 DIAGNOSIS — F17210 Nicotine dependence, cigarettes, uncomplicated: Secondary | ICD-10-CM | POA: Insufficient documentation

## 2015-03-26 DIAGNOSIS — A599 Trichomoniasis, unspecified: Secondary | ICD-10-CM

## 2015-03-26 LAB — COMPREHENSIVE METABOLIC PANEL
ALK PHOS: 53 U/L (ref 38–126)
ALT: 16 U/L (ref 14–54)
ANION GAP: 8 (ref 5–15)
AST: 24 U/L (ref 15–41)
Albumin: 3.7 g/dL (ref 3.5–5.0)
BUN: 12 mg/dL (ref 6–20)
CALCIUM: 9 mg/dL (ref 8.9–10.3)
CHLORIDE: 107 mmol/L (ref 101–111)
CO2: 22 mmol/L (ref 22–32)
Creatinine, Ser: 0.88 mg/dL (ref 0.44–1.00)
GFR calc non Af Amer: >60 mL/min (ref >60)
Glucose, Bld: 108 mg/dL — ABNORMAL HIGH (ref 65–99)
Potassium: 3.7 mmol/L (ref 3.5–5.1)
SODIUM: 137 mmol/L (ref 135–145)
Total Bilirubin: 0.3 mg/dL (ref 0.3–1.2)
Total Protein: 6.3 g/dL — ABNORMAL LOW (ref 6.5–8.1)

## 2015-03-26 LAB — URINE MICROSCOPIC-ADD ON

## 2015-03-26 LAB — URINALYSIS, ROUTINE W REFLEX MICROSCOPIC
Bilirubin Urine: NEGATIVE
Glucose, UA: NEGATIVE mg/dL
HGB URINE DIPSTICK: NEGATIVE
Ketones, ur: NEGATIVE mg/dL
Nitrite: NEGATIVE
Protein, ur: NEGATIVE mg/dL
SPECIFIC GRAVITY, URINE: 1.027 (ref 1.005–1.030)
pH: 5.5 (ref 5.0–8.0)

## 2015-03-26 LAB — CBC
HCT: 34.1 % — ABNORMAL LOW (ref 36.0–46.0)
HEMOGLOBIN: 11.1 g/dL — AB (ref 12.0–15.0)
MCH: 27.7 pg (ref 26.0–34.0)
MCHC: 32.6 g/dL (ref 30.0–36.0)
MCV: 85 fL (ref 78.0–100.0)
Platelets: 287 10*3/uL (ref 150–400)
RBC: 4.01 MIL/uL (ref 3.87–5.11)
RDW: 17.9 % — ABNORMAL HIGH (ref 11.5–15.5)
WBC: 7.3 10*3/uL (ref 4.0–10.5)

## 2015-03-26 LAB — WET PREP, GENITAL
CLUE CELLS WET PREP: NONE SEEN
Sperm: NONE SEEN
Yeast Wet Prep HPF POC: NONE SEEN

## 2015-03-26 LAB — POC URINE PREG, ED: PREG TEST UR: NEGATIVE

## 2015-03-26 MED ORDER — CEFTRIAXONE SODIUM 250 MG IJ SOLR
250.0000 mg | Freq: Once | INTRAMUSCULAR | Status: AC
  Administered 2015-03-26: 250 mg via INTRAMUSCULAR
  Filled 2015-03-26: qty 250

## 2015-03-26 MED ORDER — FLUCONAZOLE 200 MG PO TABS: 200.0000 mg | ORAL_TABLET | ORAL | Status: AC

## 2015-03-26 MED ORDER — METRONIDAZOLE 500 MG PO TABS: 500.0000 mg | ORAL_TABLET | Freq: Two times a day (BID) | ORAL | Status: DC

## 2015-03-26 MED ORDER — AZITHROMYCIN 250 MG PO TABS
1000.0000 mg | ORAL_TABLET | Freq: Once | ORAL | Status: AC
  Administered 2015-03-26: 1000 mg via ORAL
  Filled 2015-03-26: qty 4

## 2015-03-26 NOTE — ED Provider Notes (Signed)
CSN: [id number]     Arrival date & time 03/26/15  0215 History   First MD Initiated Contact with Patient 03/26/15 0459     Chief Complaint  Patient presents with  . Abdominal Pain  . Vaginal Itching  . Vaginal Discharge     (Consider location/radiation/quality/duration/timing/severity/associated sxs/prior Treatment) HPI Comments: Pt comes in with cc of vaginal itching and lower abd pain, Pt is having intermittent perineal pain, mild-moderate for the past 1.5 days. Pain lasts for few seconds. Pt has associated itching, that is the main complain. With the itching pt has vaginal discharge. No n/v/f/c currently. + hx of STD in the last 6 months and she admits to unprotected intercourse with 3-4 weeks ago.   ROS 10 Systems reviewed and are negative for acute change except as noted in the HPI.     Patient is a 39 y.o. female presenting with abdominal pain, vaginal itching, and vaginal discharge. The history is provided by the patient.  Abdominal Pain Associated symptoms: vaginal discharge   Vaginal Itching Associated symptoms include abdominal pain.  Vaginal Discharge Associated symptoms: abdominal pain and vaginal itching     Past Medical History  Diagnosis Date  . Medical history non-contributory   . Chlamydia    Past Surgical History  Procedure Laterality Date  . Appendectomy    . Hernia repair    . Tubal ligation     Family History  Problem Relation Age of Onset  . Heart disease Mother   . Hyperlipidemia Mother   . Hypertension Mother   . Diabetes Father   . Heart disease Father   . Hyperlipidemia Father   . Hypertension Father   . Cancer Father   . Stroke Father   . Asthma Maternal Aunt    Social History  Substance Use Topics  . Smoking status: Current Every Day Smoker -- 0.50 packs/day    Types: Cigars  . Smokeless tobacco: Never Used  . Alcohol Use: Yes     Comment: Pt only has a drink a few times a year.    OB History    Gravida Para Term Preterm AB  TAB SAB Ectopic Multiple Living   Review of Systems  Gastrointestinal: Positive for abdominal pain.  Genitourinary: Positive for vaginal discharge.      Allergies  Review of patient's allergies indicates no known allergies.  Home Medications   Prior to Admission medications   Medication Sig Start Date End Date Taking? Authorizing Provider  Multiple Vitamin (MULTIVITAMIN WITH MINERALS) TABS tablet Take 1 tablet by mouth daily.   Yes Historical Provider, MD  fluconazole (DIFLUCAN) 200 MG tablet Take 1 tablet (200 mg total) by mouth every 3 (three) days. 03/26/15 04/02/15  Derwood Kaplan, MD  ibuprofen (ADVIL,MOTRIN) 600 MG tablet Take 1 tablet (600 mg total) by mouth every 8 (eight) hours as needed for mild pain, moderate pain or cramping. Patient not taking: Reported on 01/22/2015 11/27/14   Pricilla Loveless, MD  metroNIDAZOLE (FLAGYL) 500 MG tablet Take 1 tablet (500 mg total) by mouth 2 (two) times daily. 03/26/15   Emonie Espericueta, MD   BP 108/82 mmHg  Pulse 68  Temp(Src) 98 F (36.7 C) (Oral)  Resp 16  Ht  (1.626 m)  Wt 119 lb (53.978 kg)  BMI 20.42 kg/m2  SpO2 99%  LMP 03/09/2015 Physical Exam  Constitutional: She is oriented to person, place, and time. She appears well-developed and  well-nourished.  HENT:  Head: Normocephalic and atraumatic.  Eyes: Conjunctivae and EOM are normal. Pupils are equal, round, and reactive to light.  Neck: Normal range of motion. Neck supple.  Cardiovascular: Normal rate, regular rhythm, normal heart sounds and intact distal pulses.   No murmur heard. Pulmonary/Chest: Effort normal. No respiratory distress. She has no wheezes.  Abdominal: Soft. Bowel sounds are normal. She exhibits no distension. There is tenderness. There is no rebound and no guarding.  Suprapubic tenderness  Genitourinary: Uterus normal. Vaginal discharge found.  DONE BY PA-C ROSE External exam - normal, no lesions Speculum exam: Pt has some  white discharge, no blood Bimanual exam: Patient has no CMT, no adnexal tenderness or fullness and cervical os is closed  Neurological: She is alert and oriented to person, place, and time.  Skin: Skin is warm and dry.  Nursing note and vitals reviewed.   ED Course  Procedures (including critical care time) Labs Review Labs Reviewed  WET PREP, GENITAL - Abnormal; Notable for the following:    Trich, Wet Prep PRESENT (*)    WBC, Wet Prep HPF POC MANY (*)    All other components within normal limits  COMPREHENSIVE METABOLIC PANEL - Abnormal; Notable for the following:    Glucose, Bld 108 (*)    Total Protein 6.3 (*)    All other components within normal limits  CBC - Abnormal; Notable for the following:    Hemoglobin 11.1 (*)    HCT 34.1 (*)    RDW 17.9 (*)    All other components within normal limits  URINALYSIS, ROUTINE W REFLEX MICROSCOPIC (NOT AT [facility]) - Abnormal; Notable for the following:    APPearance CLOUDY (*)    Leukocytes, UA MODERATE (*)    All other components within normal limits  URINE MICROSCOPIC-ADD ON - Abnormal; Notable for the following:    Squamous Epithelial / LPF 0-5 (*)    Bacteria, UA RARE (*)    All other components within normal limits  POC URINE PREG, ED  GC/CHLAMYDIA PROBE AMP ([place]) NOT AT [facility]    Imaging Review No results found. I have personally reviewed and evaluated these images and lab results as part of my medical decision-making.   EKG Interpretation None      MDM   Final diagnoses:  Trichomonal infection  Vaginal itching    Pt comes in with cc of vaginal itching and perineal pain that is intermittent. Has STD risk factors and recent STDs. Pelvic exam benign besides discharge, and she has trichomonas. GC, Chlamydia meds given empirically. Flagyl po for 7 days provided. Diflucan also given.    Derwood KaplanAnkit Shamyia Grandpre, MD 03/26/15 [phone]

## 2015-03-26 NOTE — ED Notes (Signed)
Patient presents stating she has been having lower abd pain with vaginal itching and discharge.

## 2015-03-26 NOTE — Discharge Instructions (Signed)
The results here show persistent trichomonas infection - take the antibiotics and see the primary doctor in 1 week. Diflucan every 3 days prescribed for the suspected yeast infection.   Trichomoniasis Trichomoniasis is an infection caused by an organism called Trichomonas. The infection can affect both women and men. In women, the outer female genitalia and the vagina are affected. In men, the penis is mainly affected, but the prostate and other reproductive organs can also be involved. Trichomoniasis is a sexually transmitted infection (STI) and is most often passed to another person through sexual contact.  RISK FACTORS  Having unprotected sexual intercourse.  Having sexual intercourse with an infected partner. SIGNS AND SYMPTOMS  Symptoms of trichomoniasis in women include:  Abnormal gray-green frothy vaginal discharge.  Itching and irritation of the vagina.  Itching and irritation of the area outside the vagina. Symptoms of trichomoniasis in men include:   Penile discharge with or without pain.  Pain during urination. This results from inflammation of the urethra. DIAGNOSIS  Trichomoniasis may be found during a Pap test or physical exam. Your health care provider may use one of the following methods to help diagnose this infection:  Testing the pH of the vagina with a test tape.  Using a vaginal swab test that checks for the Trichomonas organism. A test is available that provides results within a few minutes.  Examining a urine sample.  Testing vaginal secretions. Your health care provider may test you for other STIs, including HIV. TREATMENT   You may be given medicine to fight the infection. Women should inform their health care provider if they could be or are pregnant. Some medicines used to treat the infection should not be taken during pregnancy.  Your health care provider may recommend over-the-counter medicines or creams to decrease itching or irritation.  Your  sexual partner will need to be treated if infected.  Your health care provider may test you for infection again 3 months after treatment. HOME CARE INSTRUCTIONS   Take medicines only as directed by your health care provider.  Take over-the-counter medicine for itching or irritation as directed by your health care provider.  Do not have sexual intercourse while you have the infection.  Women should not douche or wear tampons while they have the infection.  Discuss your infection with your partner. Your partner may have gotten the infection from you, or you may have gotten it from your partner.  Have your sex partner get examined and treated if necessary.  Practice safe, informed, and protected sex.  See your health care provider for other STI testing. SEEK MEDICAL CARE IF:   You still have symptoms after you finish your medicine.  You develop abdominal pain.  You have pain when you urinate.  You have bleeding after sexual intercourse.  You develop a rash.  Your medicine makes you sick or makes you throw up (vomit). MAKE SURE YOU:  Understand these instructions.  Will watch your condition.  Will get help right away if you are not doing well or get worse.   This information is not intended to replace advice given to you by your health care provider. Make sure you discuss any questions you have with your health care provider.   Document Released: 09/18/2000 Document Revised: 04/15/2014 Document Reviewed: 01/04/2013 Elsevier Interactive Patient Education 2016 [employer].  Sexually Transmitted Disease A sexually transmitted disease (STD) is a disease or infection that may be passed (transmitted) from person to person, usually during sexual activity. This may  happen by way of saliva, semen, blood, vaginal mucus, or urine. Common STDs include:  Gonorrhea.  Chlamydia.  Syphilis.  HIV and AIDS.  Genital herpes.  Hepatitis B and C.  Trichomonas.  Human  papillomavirus (HPV).  Pubic lice.  Scabies.  Mites.  Bacterial vaginosis. WHAT ARE CAUSES OF STDs? An STD may be caused by bacteria, a virus, or parasites. STDs are often transmitted during sexual activity if one person is infected. However, they may also be transmitted through nonsexual means. STDs may be transmitted after:   Sexual intercourse with an infected person.  Sharing sex toys with an infected person.  Sharing needles with an infected person or using unclean piercing or tattoo needles.  Having intimate contact with the genitals, mouth, or rectal areas of an infected person.  Exposure to infected fluids during birth. WHAT ARE THE SIGNS AND SYMPTOMS OF STDs? Different STDs have different symptoms. Some people may not have any symptoms. If symptoms are present, they may include:  Painful or bloody urination.  Pain in the pelvis, abdomen, vagina, anus, throat, or eyes.  A skin rash, itching, or irritation.  Growths, ulcerations, blisters, or sores in the genital and anal areas.  Abnormal vaginal discharge with or without bad odor.  Penile discharge in men.  Fever.  Pain or bleeding during sexual intercourse.  Swollen glands in the groin area.  Yellow skin and eyes (jaundice). This is seen with hepatitis.  Swollen testicles.  Infertility.  Sores and blisters in the mouth. HOW ARE STDs DIAGNOSED? To make a diagnosis, your health care provider may:  Take a medical history.  Perform a physical exam.  Take a sample of any discharge to examine.  Swab the throat, cervix, opening to the penis, rectum, or vagina for testing.  Test a sample of your first morning urine.  Perform blood tests.  Perform a Pap test, if this applies.  Perform a colposcopy.  Perform a laparoscopy. HOW ARE STDs TREATED? Treatment depends on the STD. Some STDs may be treated but not cured.  Chlamydia, gonorrhea, trichomonas, and syphilis can be cured with antibiotic  medicine.  Genital herpes, hepatitis, and HIV can be treated, but not cured, with prescribed medicines. The medicines lessen symptoms.  Genital warts from HPV can be treated with medicine or by freezing, burning (electrocautery), or surgery. Warts may come back.  HPV cannot be cured with medicine or surgery. However, abnormal areas may be removed from the cervix, vagina, or vulva.  If your diagnosis is confirmed, your recent sexual partners need treatment. This is true even if they are symptom-free or have a negative culture or evaluation. They should not have sex until their health care providers say it is okay.  Your health care provider may test you for infection again 3 months after treatment. HOW CAN I REDUCE MY RISK OF GETTING AN STD? Take these steps to reduce your risk of getting an STD:  Use latex condoms, dental dams, and water-soluble lubricants during sexual activity. Do not use petroleum jelly or oils.  Avoid having multiple sex partners.  Do not have sex with someone who has other sex partners  Do not have sex with anyone you do not know or who is at high risk for an STD.  Avoid risky sex practices that can break your skin.  Do not have sex if you have open sores on your mouth or skin.  Avoid drinking too much alcohol or taking illegal drugs. Alcohol and drugs can affect your judgment  and put you in a vulnerable position.  Avoid engaging in oral and anal sex acts.  Get vaccinated for HPV and hepatitis. If you have not received these vaccines in the past, talk to your health care provider about whether one or both might be right for you.  If you are at risk of being infected with HIV, it is recommended that you take a prescription medicine daily to prevent HIV infection. This is called pre-exposure prophylaxis (PrEP). You are considered at risk if:  You are a man who has sex with other men (MSM).  You are a heterosexual man or woman and are sexually active with more  than one partner.  You take drugs by injection.  You are sexually active with a partner who has HIV.  Talk with your health care provider about whether you are at high risk of being infected with HIV. If you choose to begin PrEP, you should first be tested for HIV. You should then be tested every 3 months for as long as you are taking PrEP. WHAT SHOULD I DO IF I THINK I HAVE AN STD?  See your health care provider.  Tell your sexual partner(s). They should be tested and treated for any STDs.  Do not have sex until your health care provider says it is okay. WHEN SHOULD I GET IMMEDIATE MEDICAL CARE? Contact your health care provider right away if:   You have severe abdominal pain.  You are a man and notice swelling or pain in your testicles.  You are a woman and notice swelling or pain in your vagina.   This information is not intended to replace advice given to you by your health care provider. Make sure you discuss any questions you have with your health care provider.   Document Released: 06/15/2002 Document Revised: 04/15/2014 Document Reviewed: 10/13/2012 Elsevier Interactive Patient Education 2016 Elsevier Inc.  Pruritus Pruritus is an itching feeling. There are many different conditions and factors that can make your skin itchy. Dry skin is one of the most common causes of itching. Most cases of itching do not require medical attention. Itchy skin can turn into a rash.  HOME CARE INSTRUCTIONS  Watch your pruritus for any changes. Take these steps to help with your condition:  Skin Care  Moisturize your skin as needed. A moisturizer that contains petroleum jelly is best for keeping moisture in your skin.  Take or apply medicines only as directed by your health care provider. This may include:  Corticosteroid cream.  Anti-itch lotions.  Oral anti-histamines.  Apply cool compresses to the affected areas.  Try taking a bath with:  Epsom salts. Follow the instructions  on the packaging. You can get these at your local pharmacy or grocery store.  Baking soda. Pour a small amount into the bath as directed by your health care provider.  Colloidal oatmeal. Follow the instructions on the packaging. You can get this at your local pharmacy or grocery store.  Try applying baking soda paste to your skin. Stir water into baking soda until it reaches a paste-like consistency.   Do not scratch your skin.  Avoid hot showers or baths, which can make itching worse. A cold shower may help with itching as long as you use a moisturizer after.  Avoid scented soaps, detergents, and perfumes. Use gentle soaps, detergents, perfumes, and other cosmetic products. General Instructions  Avoid wearing tight clothes.  Keep a journal to help track what causes your itch. Write down:  What  you eat.  What cosmetic products you use.  What you drink.  What you wear. This includes jewelry.  Use a humidifier. This keeps the air moist, which helps to prevent dry skin. SEEK MEDICAL CARE IF:  The itching does not go away after several days.  You sweat at night.  You have weight loss.  You are unusually thirsty.  You urinate more than normal.  You are more tired than normal.  You have abdominal pain.  Your skin tingles.  You feel weak.  Your skin or the whites of your eyes look yellow (jaundice).  Your skin feels numb.   This information is not intended to replace advice given to you by your health care provider. Make sure you discuss any questions you have with your health care provider.   Document Released: 12/05/2010 Document Revised: 08/09/2014 Document Reviewed: 03/21/2014 Elsevier Interactive Patient Education [employer]

## 2015-03-27 LAB — GC/CHLAMYDIA PROBE AMP (~~LOC~~) NOT AT ARMC
CHLAMYDIA, DNA PROBE: NEGATIVE
NEISSERIA GONORRHEA: NEGATIVE

## 2015-09-24 ENCOUNTER — Encounter (HOSPITAL_COMMUNITY): Payer: Self-pay | Admitting: Emergency Medicine

## 2015-09-24 ENCOUNTER — Emergency Department (HOSPITAL_COMMUNITY): Payer: 59

## 2015-09-24 ENCOUNTER — Emergency Department (HOSPITAL_COMMUNITY)
Admission: EM | Admit: 2015-09-24 | Discharge: 2015-09-24 | Disposition: A | Discharge status: Home / Self Care | Payer: 59 | Attending: Emergency Medicine | Admitting: Emergency Medicine

## 2015-09-24 DIAGNOSIS — Y999 Unspecified external cause status: Secondary | ICD-10-CM | POA: Insufficient documentation

## 2015-09-24 DIAGNOSIS — Y9301 Activity, walking, marching and hiking: Secondary | ICD-10-CM | POA: Diagnosis not present

## 2015-09-24 DIAGNOSIS — X509XXA Other and unspecified overexertion or strenuous movements or postures, initial encounter: Secondary | ICD-10-CM | POA: Diagnosis not present

## 2015-09-24 DIAGNOSIS — S93601A Unspecified sprain of right foot, initial encounter: Secondary | ICD-10-CM | POA: Diagnosis not present

## 2015-09-24 DIAGNOSIS — Y929 Unspecified place or not applicable: Secondary | ICD-10-CM | POA: Insufficient documentation

## 2015-09-24 DIAGNOSIS — F17210 Nicotine dependence, cigarettes, uncomplicated: Secondary | ICD-10-CM | POA: Diagnosis not present

## 2015-09-24 DIAGNOSIS — S99921A Unspecified injury of right foot, initial encounter: Secondary | ICD-10-CM | POA: Diagnosis present

## 2015-09-24 MED ORDER — IBUPROFEN 800 MG PO TABS: 800.0000 mg | ORAL_TABLET | Freq: Three times a day (TID) | ORAL | Status: DC

## 2015-09-24 NOTE — ED Provider Notes (Signed)
CSN: [id number]     Arrival date & time 09/24/15  1529 History  By signing my name below, I, Jordan Leblanc, attest that this documentation has been prepared under the direction and in the presence of Roxy Horseman, PA-C Electronically Signed: Soijett Leblanc, ED Scribe. 09/24/2015. 4:30 PM.   Chief Complaint  Patient presents with  . Foot Injury      The history is provided by the patient. No language interpreter was used.    Jordan Leblanc is a 40 y.o. female who presents to the Emergency Department complaining of right foot injury occurring PTA. Pt reports that she was ambulating down stairs when she twisted her right foot on the last step. Pt states that her right foot pain radiates up her right lower leg. Pt notes that her right foot pain is worsened with ambulation and palpation. Denies any alleviating factors. Pt is having associated symptoms of right foot swelling and gait problem due to pain. She notes that she has tried elevation and ice with no relief of her symptoms. She denies color change, wound, rash, and any other symptoms.    Past Medical History  Diagnosis Date  . Medical history non-contributory   . Chlamydia    Past Surgical History  Procedure Laterality Date  . Appendectomy    . Hernia repair    . Tubal ligation     Family History  Problem Relation Age of Onset  . Heart disease Mother   . Hyperlipidemia Mother   . Hypertension Mother   . Diabetes Father   . Heart disease Father   . Hyperlipidemia Father   . Hypertension Father   . Cancer Father   . Stroke Father   . Asthma Maternal Aunt    Social History  Substance Use Topics  . Smoking status: Current Every Day Smoker -- 0.50 packs/day    Types: Cigars  . Smokeless tobacco: Never Used  . Alcohol Use: Yes     Comment: Pt only has a drink a few times a year.    OB History    Gravida Para Term Preterm AB TAB SAB Ectopic Multiple Living   Review of Systems  Musculoskeletal:  Positive for joint swelling (right foot) and arthralgias (right foot).  Skin: Negative for color change, rash and wound.  All other systems reviewed and are negative.     Allergies  Review of patient's allergies indicates no known allergies.  Home Medications   Prior to Admission medications   Medication Sig Start Date End Date Taking? Authorizing Provider  ibuprofen (ADVIL,MOTRIN) 600 MG tablet Take 1 tablet (600 mg total) by mouth every 8 (eight) hours as needed for mild pain, moderate pain or cramping. Patient not taking: Reported on 01/22/2015 11/27/14   Pricilla Loveless, MD  metroNIDAZOLE (FLAGYL) 500 MG tablet Take 1 tablet (500 mg total) by mouth 2 (two) times daily. 03/26/15   Derwood Kaplan, MD  Multiple Vitamin (MULTIVITAMIN WITH MINERALS) TABS tablet Take 1 tablet by mouth daily.    Historical Provider, MD   BP 118/77 mmHg  Pulse 86  Temp(Src) 98.6 F (37 C)  Resp 18  Ht  (1.6 m)  Wt 116 lb (52.617 kg)  BMI 20.55 kg/m2  SpO2 100%  LMP 09/19/2015   Physical Exam  Physical Exam  Constitutional: Pt appears well-developed and well-nourished. No distress.  HENT:  Head: Normocephalic and atraumatic.  Eyes: Conjunctivae are normal.  Neck: Normal range of motion.  Cardiovascular: Normal rate, regular rhythm and intact distal pulses.   Capillary refill < 3 sec  Pulmonary/Chest: Effort normal and breath sounds normal.  Musculoskeletal: Pt exhibits tenderness to palpation over lateral midfoot. Pt exhibits no edema.  ROM: 4/5  Neurological: Pt  is alert. Coordination normal.  Sensation 5/5 Strength 4/5  Skin: Skin is warm and dry. Pt is not diaphoretic.  No tenting of the skin  Psychiatric: Pt has a normal mood and affect.  Nursing note and vitals reviewed.  ED Course  Procedures (including critical care time) DIAGNOSTIC STUDIES: Oxygen Saturation is 100% on RA, nl by my interpretation.    COORDINATION OF CARE: 4:27 PM Discussed treatment plan with pt at  bedside which includes right foot xray, splint, crutches, referral to orthopedist, and RICE therapy, and pt agreed to plan.    Imaging Review Dg Foot Complete Right  09/24/2015  CLINICAL DATA:  Larey SeatFell downstairs landing on lateral foot, twisted injury. Lateral foot pain. EXAM: RIGHT FOOT COMPLETE - 3+ VIEW COMPARISON:  None. FINDINGS: There is no evidence of fracture or dislocation. Os perineum. Type 2 navicular bone. There is no evidence of arthropathy or other focal bone abnormality. Soft tissues are unremarkable. IMPRESSION: Negative. Electronically Signed   By: Awilda Metroourtnay  Bloomer M.D.   On: 09/24/2015 16:01   I have personally reviewed and evaluated these images as part of my medical decision-making.   MDM   Final diagnoses:  Foot sprain, right, initial encounter    Patient X-Ray negative for obvious fracture or dislocation.  Pt advised to follow up with orthopedics. Patient given splint and crutches while in ED, conservative therapy recommended and discussed. Patient will be discharged home & is agreeable with above plan. Returns precautions discussed. Pt appears safe for discharge.   I personally performed the services described in this documentation, which was scribed in my presence. The recorded information has been reviewed and is accurate.      Roxy Horsemanobert Lois Slagel, PA-C 09/24/15 1635  Lavera Guiseana Duo Liu, MD 09/25/15 0040

## 2015-09-24 NOTE — ED Notes (Signed)
Patient complains of right foot pain after twisting same while walking down steps, lateral swelling to same

## 2015-09-24 NOTE — ED Notes (Signed)
Ortho called and coming

## 2015-09-24 NOTE — Progress Notes (Signed)
Orthopedic Tech Progress Note Patient Details:  Sampson SiShanta M Dyck 05/11/1975 [id number]  Ortho Devices Type of Ortho Device: Crutches, ASO Ortho Device/Splint Interventions: Application   Saul FordyceJennifer C Masae Lukacs 09/24/2015, 4:43 PM

## 2015-09-24 NOTE — Progress Notes (Signed)
Orthopedic Tech Progress Note Patient Details:  Jordan Leblanc 09/22/1975 [id number]  Ortho Devices Type of Ortho Device: Crutches, ASO Ortho Device/Splint Interventions: Application   Blondell Laperle C Essica Kiker 09/24/2015, 4:43 PM  

## 2015-09-24 NOTE — Discharge Instructions (Signed)

## 2016-05-07 ENCOUNTER — Emergency Department (HOSPITAL_COMMUNITY)
Admission: EM | Admit: 2016-05-07 | Discharge: 2016-05-07 | Disposition: A | Discharge status: Home / Self Care | Payer: 59 | Attending: Dermatology | Admitting: Dermatology

## 2016-05-07 ENCOUNTER — Encounter (HOSPITAL_COMMUNITY): Payer: Self-pay | Admitting: Emergency Medicine

## 2016-05-07 DIAGNOSIS — S199XXA Unspecified injury of neck, initial encounter: Secondary | ICD-10-CM | POA: Diagnosis present

## 2016-05-07 DIAGNOSIS — Z5321 Procedure and treatment not carried out due to patient leaving prior to being seen by health care provider: Secondary | ICD-10-CM | POA: Diagnosis not present

## 2016-05-07 DIAGNOSIS — Y939 Activity, unspecified: Secondary | ICD-10-CM | POA: Diagnosis not present

## 2016-05-07 DIAGNOSIS — F17290 Nicotine dependence, other tobacco product, uncomplicated: Secondary | ICD-10-CM | POA: Insufficient documentation

## 2016-05-07 DIAGNOSIS — Y999 Unspecified external cause status: Secondary | ICD-10-CM | POA: Insufficient documentation

## 2016-05-07 DIAGNOSIS — Y92410 Unspecified street and highway as the place of occurrence of the external cause: Secondary | ICD-10-CM | POA: Insufficient documentation
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
## 2016-05-07 NOTE — ED Notes (Signed)
C-collar applied in triage.

## 2016-05-07 NOTE — ED Notes (Signed)
Pt c/o midline cervical tenderness.

## 2016-05-07 NOTE — ED Notes (Signed)
Pt requesting to leave AMA, pt had c-collar in hand. Informed pt of delay; pt states "I have waited too long and I'm leaving."

## 2016-08-09 ENCOUNTER — Emergency Department (HOSPITAL_COMMUNITY): Payer: 59

## 2016-08-09 ENCOUNTER — Emergency Department (HOSPITAL_COMMUNITY)
Admission: EM | Admit: 2016-08-09 | Discharge: 2016-08-10 | Disposition: A | Discharge status: Home / Self Care | Payer: 59 | Attending: Emergency Medicine | Admitting: Emergency Medicine

## 2016-08-09 ENCOUNTER — Encounter (HOSPITAL_COMMUNITY): Payer: Self-pay

## 2016-08-09 DIAGNOSIS — R079 Chest pain, unspecified: Secondary | ICD-10-CM | POA: Diagnosis present

## 2016-08-09 DIAGNOSIS — K219 Gastro-esophageal reflux disease without esophagitis: Secondary | ICD-10-CM | POA: Diagnosis not present

## 2016-08-09 DIAGNOSIS — R0789 Other chest pain: Secondary | ICD-10-CM

## 2016-08-09 DIAGNOSIS — F17290 Nicotine dependence, other tobacco product, uncomplicated: Secondary | ICD-10-CM | POA: Insufficient documentation

## 2016-08-09 LAB — CBC
HEMATOCRIT: 32.6 % — AB (ref 36.0–46.0)
Hemoglobin: 10.7 g/dL — ABNORMAL LOW (ref 12.0–15.0)
MCH: 28.8 pg (ref 26.0–34.0)
MCHC: 32.8 g/dL (ref 30.0–36.0)
MCV: 87.6 fL (ref 78.0–100.0)
Platelets: 335 10*3/uL (ref 150–400)
RBC: 3.72 MIL/uL — ABNORMAL LOW (ref 3.87–5.11)
RDW: 18 % — ABNORMAL HIGH (ref 11.5–15.5)
WBC: 8 10*3/uL (ref 4.0–10.5)

## 2016-08-09 LAB — BASIC METABOLIC PANEL
ANION GAP: 9 (ref 5–15)
BUN: 8 mg/dL (ref 6–20)
CALCIUM: 8.7 mg/dL — AB (ref 8.9–10.3)
CHLORIDE: 103 mmol/L (ref 101–111)
CO2: 22 mmol/L (ref 22–32)
Creatinine, Ser: 0.92 mg/dL (ref 0.44–1.00)
GFR calc Af Amer: >60 mL/min (ref >60)
GFR calc non Af Amer: >60 mL/min (ref >60)
GLUCOSE: 98 mg/dL (ref 65–99)
POTASSIUM: 3.2 mmol/L — AB (ref 3.5–5.1)
Sodium: 134 mmol/L — ABNORMAL LOW (ref 135–145)

## 2016-08-09 LAB — I-STAT TROPONIN, ED: Troponin i, poc: 0 ng/mL (ref 0.00–0.08)

## 2016-08-09 MED ORDER — FAMOTIDINE 20 MG PO TABS
40.0000 mg | ORAL_TABLET | Freq: Once | ORAL | Status: AC
  Administered 2016-08-10: 40 mg via ORAL
  Filled 2016-08-09: qty 2

## 2016-08-09 MED ORDER — ONDANSETRON 4 MG PO TBDP
4.0000 mg | ORAL_TABLET | Freq: Once | ORAL | Status: AC
  Administered 2016-08-10: 4 mg via ORAL
  Filled 2016-08-09: qty 1

## 2016-08-09 MED ORDER — GI COCKTAIL ~~LOC~~
30.0000 mL | Freq: Once | ORAL | Status: AC
  Administered 2016-08-10: 30 mL via ORAL
  Filled 2016-08-09: qty 30

## 2016-08-09 NOTE — ED Notes (Signed)
Pt states that this happened last month but went away this time she had to take antacids and drink a coke to help with the pain but states that she still had pressure

## 2016-08-09 NOTE — ED Provider Notes (Signed)
By signing my name below, I, Talbert NanPaul Grant, attest that this documentation has been prepared under the direction and in the presence of Lacinda Curvin, Layla MawKristen N, DO. Electronically Signed: Talbert NanPaul Grant, Scribe. 08/09/16. 11:45 PM.  TIME SEEN: 11:35 PM  CHIEF COMPLAINT: Chest pain  HPI: Jordan Leblanc is a 41 y.o. female with history of tobacco use who presents to the Emergency Department complaining of moderate constant, resolving, chest pain that started yesterday evening. The pain has not resolved completely. Pt has associated intermittent SOB, vomiting last night. Pt has taken omeprazole 40 mg with partial relief PTA 1230 pm. Pt denies any cough. Pt is a smoker. Pt has not cardiac hx. does have a family history with a father who had a heart attack in his late 2150s. She denies history of PE or DVT. States that pain felt like a "air bubble". Radiated into her back. Has been constant but waxes and wanes since yesterday evening. PCP [facility] at [facility].   ROS: See HPI Constitutional: no fever  Eyes: no drainage  ENT: no runny nose   Cardiovascular:  chest pain  Resp: SOB  GI:  vomiting GU: no dysuria Integumentary: no rash  Allergy: no hives  Musculoskeletal: no leg swelling  Neurological: no slurred speech ROS otherwise negative  PAST MEDICAL HISTORY/PAST SURGICAL HISTORY:  Past Medical History:  Diagnosis Date  . Chlamydia   . Medical history non-contributory     MEDICATIONS:  Prior to Admission medications   Medication Sig Start Date End Date Taking? Authorizing Provider  ibuprofen (ADVIL,MOTRIN) 800 MG tablet Take 1 tablet (800 mg total) by mouth 3 (three) times daily. 09/24/15   Roxy HorsemanBrowning, Robert, PA-C  metroNIDAZOLE (FLAGYL) 500 MG tablet Take 1 tablet (500 mg total) by mouth 2 (two) times daily. 03/26/15   Derwood KaplanNanavati, Ankit, MD  Multiple Vitamin (MULTIVITAMIN WITH MINERALS) TABS tablet Take 1 tablet by mouth daily.    [provider]    ALLERGIES:  No Known  Allergies  SOCIAL HISTORY:  Social History  Substance Use Topics  . Smoking status: Current Every Day Smoker    Packs/day: 0.50    Types: Cigars  . Smokeless tobacco: Never Used  . Alcohol use Yes     Comment: Pt only has a drink a few times a year.     FAMILY HISTORY: Family History  Problem Relation Age of Onset  . Heart disease Mother   . Hyperlipidemia Mother   . Hypertension Mother   . Diabetes Father   . Heart disease Father   . Hyperlipidemia Father   . Hypertension Father   . Cancer Father   . Stroke Father   . Asthma Maternal Aunt     EXAM: BP (!) 116/96 (BP Location: Right Arm)   Pulse 84   Temp 98.3 F (36.8 C) (Oral)   Resp 18   LMP 08/05/2016 (Exact Date)   SpO2 98%  CONSTITUTIONAL: Alert and oriented and responds appropriately to questions. Well-appearing; well-nourished HEAD: Normocephalic EYES: Conjunctivae clear, pupils appear equal, EOMI ENT: normal nose; moist mucous membranes NECK: Supple, no meningismus, no nuchal rigidity, no LAD  CARD: RRR; S1 and S2 appreciated; no murmurs, no clicks, no rubs, no gallops RESP: Normal chest excursion without splinting or tachypnea; breath sounds clear and equal bilaterally; no wheezes, no rhonchi, no rales, no hypoxia or respiratory distress, speaking full sentences ABD/GI: Normal bowel sounds; non-distended; soft, non-tender, no rebound, no guarding, no peritoneal signs, no hepatosplenomegaly BACK:  The back appears  normal and is non-tender to palpation, there is no CVA tenderness EXT: Normal ROM in all joints; non-tender to palpation; no edema; normal capillary refill; no cyanosis, no calf tenderness or swelling    SKIN: Normal color for age and race; warm; no rash NEURO: Moves all extremities equally PSYCH: The patient's mood and manner are appropriate. Grooming and personal hygiene are appropriate.  MEDICAL DECISION MAKING: Patient here with atypical chest pain. Patient's heart score is 1. Doubt PE or DVT.  Doubt dissection. Hemodynamically stable. Benign abdomen. Suspect acid reflux. Symptoms improved with omeprazole. We'll give GI cocktail, Pepcid, Zofran. First troponin negative. Chest x-ray clear. He is to repeat second troponin. EKG shows no ischemic abnormality.  ED PROGRESS: 1:20 AM  Pt's second troponin negative. Pain has significantly improved after GI cocktail, Pepcid and Zofran. We'll discharge with prescriptions for omeprazole and outpatient follow-up with PCP. Discussed return precautions and recommended diet changes. Patient comfortable with this plan.   At this time, I do not feel there is any life-threatening condition present. I have reviewed and discussed all results (EKG, imaging, lab, urine as appropriate) and exam findings with patient/family. I have reviewed nursing notes and appropriate previous records.  I feel the patient is safe to be discharged home without further emergent workup and can continue workup as an outpatient as needed. Discussed usual and customary return precautions. Patient/family verbalize understanding and are comfortable with this plan.  Outpatient follow-up has been provided if needed. All questions have been answered.    EKG Interpretation  Date/Time:  Friday Aug 09 2016 19:06:29 EDT Ventricular Rate:  83 PR Interval:  124 QRS Duration: 82 QT Interval:  358 QTC Calculation: 420 R Axis:   35 Text Interpretation:  Normal sinus rhythm Cannot rule out Anterior infarct , age undetermined Abnormal ECG No significant change since last tracing Confirmed by Rupal Childress,  DO, Eilish Mcdaniel [phone]) on 08/09/2016 11:09:26 PM       I personally performed the services described in this documentation, which was scribed in my presence. The recorded information has been reviewed and is accurate.     Soraiya Ahner, Layla Maw, DO 08/10/16 [phone]

## 2016-08-09 NOTE — ED Triage Notes (Signed)
Pt complaining of left sided chest pain that radiates to back. Pt states ongoing x 2 days. Pt denies any cough, complaining of some SOB. Pt a/o x 4, NAD. VSS>

## 2016-08-10 LAB — I-STAT TROPONIN, ED: Troponin i, poc: 0 ng/mL (ref 0.00–0.08)

## 2016-08-10 MED ORDER — OMEPRAZOLE 40 MG PO CPDR: 40.0000 mg | DELAYED_RELEASE_CAPSULE | Freq: Every day | ORAL | 1 refills | Status: AC

## 2016-08-10 MED ORDER — ONDANSETRON 4 MG PO TBDP: 4.0000 mg | ORAL_TABLET | Freq: Three times a day (TID) | ORAL | 0 refills | Status: AC | PRN

## 2016-08-10 NOTE — Discharge Instructions (Signed)
To find a primary care or specialty doctor please call [phone] or [phone] to access "[facility] Find a Doctor Service." ° °You may also go on the [facility] website at [email or web address] ° °There are also multiple [facility], [facility] practices throughout the [place] that are frequently accepting new patients. You may find a clinic that is close to your home and contact them. ° °[facility] -  °[address] °[place] [address] °[phone] ° ° °[facility] -  °[address] °[place] [address] °[phone] ° ° °[facility] - °[address]  °[place] [address] °[phone] ° ° °

## 2016-08-10 NOTE — ED Notes (Addendum)
PT states understanding of care given, follow up care, and medication prescribed. PT ambulated from ED to car with a steady gait. 

## 2017-09-11 ENCOUNTER — Emergency Department (HOSPITAL_COMMUNITY): Payer: BLUE CROSS/BLUE SHIELD

## 2017-09-11 ENCOUNTER — Other Ambulatory Visit: Payer: Self-pay

## 2017-09-11 ENCOUNTER — Emergency Department (HOSPITAL_COMMUNITY)
Admission: EM | Admit: 2017-09-11 | Discharge: 2017-09-11 | Disposition: A | Discharge status: Home / Self Care | Payer: BLUE CROSS/BLUE SHIELD | Attending: Emergency Medicine | Admitting: Emergency Medicine

## 2017-09-11 ENCOUNTER — Encounter (HOSPITAL_COMMUNITY): Payer: Self-pay | Admitting: *Deleted

## 2017-09-11 DIAGNOSIS — N83201 Unspecified ovarian cyst, right side: Secondary | ICD-10-CM | POA: Diagnosis not present

## 2017-09-11 DIAGNOSIS — F17210 Nicotine dependence, cigarettes, uncomplicated: Secondary | ICD-10-CM | POA: Diagnosis not present

## 2017-09-11 DIAGNOSIS — R109 Unspecified abdominal pain: Secondary | ICD-10-CM | POA: Diagnosis present

## 2017-09-11 DIAGNOSIS — R102 Pelvic and perineal pain: Secondary | ICD-10-CM

## 2017-09-11 DIAGNOSIS — Z79899 Other long term (current) drug therapy: Secondary | ICD-10-CM | POA: Diagnosis not present

## 2017-09-11 LAB — URINALYSIS, ROUTINE W REFLEX MICROSCOPIC
Bilirubin Urine: NEGATIVE
Glucose, UA: NEGATIVE mg/dL
KETONES UR: NEGATIVE mg/dL
Leukocytes, UA: NEGATIVE
Nitrite: NEGATIVE
PROTEIN: NEGATIVE mg/dL
Specific Gravity, Urine: 1.026 (ref 1.005–1.030)
pH: 5 (ref 5.0–8.0)

## 2017-09-11 LAB — CBC
HCT: 31.4 % — ABNORMAL LOW (ref 36.0–46.0)
Hemoglobin: 9.7 g/dL — ABNORMAL LOW (ref 12.0–15.0)
MCH: 25.5 pg — ABNORMAL LOW (ref 26.0–34.0)
MCHC: 30.9 g/dL (ref 30.0–36.0)
MCV: 82.6 fL (ref 78.0–100.0)
Platelets: 357 10*3/uL (ref 150–400)
RBC: 3.8 MIL/uL — ABNORMAL LOW (ref 3.87–5.11)
RDW: 17.2 % — AB (ref 11.5–15.5)
WBC: 7.8 10*3/uL (ref 4.0–10.5)

## 2017-09-11 LAB — COMPREHENSIVE METABOLIC PANEL
ALBUMIN: 3.8 g/dL (ref 3.5–5.0)
ALK PHOS: 39 U/L (ref 38–126)
ALT: 13 U/L — ABNORMAL LOW (ref 14–54)
ANION GAP: 8 (ref 5–15)
AST: 21 U/L (ref 15–41)
BILIRUBIN TOTAL: 0.5 mg/dL (ref 0.3–1.2)
BUN: 9 mg/dL (ref 6–20)
CALCIUM: 9.2 mg/dL (ref 8.9–10.3)
CO2: 25 mmol/L (ref 22–32)
Chloride: 108 mmol/L (ref 101–111)
Creatinine, Ser: 0.98 mg/dL (ref 0.44–1.00)
GFR calc Af Amer: >60 mL/min (ref >60)
GLUCOSE: 88 mg/dL (ref 65–99)
Potassium: 3.3 mmol/L — ABNORMAL LOW (ref 3.5–5.1)
Sodium: 141 mmol/L (ref 135–145)
TOTAL PROTEIN: 6.4 g/dL — AB (ref 6.5–8.1)

## 2017-09-11 LAB — LIPASE, BLOOD: Lipase: 30 U/L (ref 11–51)

## 2017-09-11 LAB — I-STAT BETA HCG BLOOD, ED (MC, WL, AP ONLY)

## 2017-09-11 MED ORDER — SODIUM CHLORIDE 0.9 % IV SOLN
1.0000 g | Freq: Once | INTRAVENOUS | Status: AC
  Administered 2017-09-11: 1 g via INTRAVENOUS
  Filled 2017-09-11: qty 10

## 2017-09-11 MED ORDER — IBUPROFEN 800 MG PO TABS: 800.0000 mg | ORAL_TABLET | Freq: Three times a day (TID) | ORAL | 0 refills | Status: DC | PRN

## 2017-09-11 MED ORDER — TRAMADOL HCL 50 MG PO TABS: 50.0000 mg | ORAL_TABLET | Freq: Four times a day (QID) | ORAL | 0 refills | Status: DC | PRN

## 2017-09-11 MED ORDER — SODIUM CHLORIDE 0.9 % IV BOLUS
1000.0000 mL | Freq: Once | INTRAVENOUS | Status: AC
  Administered 2017-09-11: 1000 mL via INTRAVENOUS

## 2017-09-11 MED ORDER — IOHEXOL 300 MG/ML  SOLN
100.0000 mL | Freq: Once | INTRAMUSCULAR | Status: AC | PRN
  Administered 2017-09-11: 100 mL via INTRAVENOUS

## 2017-09-11 MED ORDER — DOXYCYCLINE HYCLATE 100 MG PO CAPS: 100.0000 mg | ORAL_CAPSULE | Freq: Two times a day (BID) | ORAL | 0 refills | Status: DC

## 2017-09-11 NOTE — Discharge Instructions (Addendum)
Follow up with the GYN clinic for follow up on this ovarian cyst. Return here needed.

## 2017-09-11 NOTE — ED Triage Notes (Signed)
Lt abd pain and lower back for 2 weeks  No temp nauseated lmp last sat

## 2017-09-12 NOTE — ED Provider Notes (Signed)
[facility] Provider Note   CSN: [id number] Arrival date & time: 09/11/17  0236     History   Chief Complaint Chief Complaint  Patient presents with  . Abdominal Pain    HPI Jordan Leblanc is a 42 y.o. female.  HPI Patient presents to the emergency department with lower abdominal pain that started 2 weeks ago.  The patient states that nothing seems to make the condition better or worse.  She states that she did not take any medications other than Tylenol prior to arrival.  Patient states that his did not seem to help with her pain.  Patient states that she recently finished her period but the pain persisted.  The patient denies chest pain, shortness of breath, headache,blurred vision, neck pain, fever, cough, weakness, numbness, dizziness, anorexia, edema,nausea, vomiting, diarrhea, rash, back pain, dysuria, hematemesis, bloody stool, near syncope, or syncope. Past Medical History:  Diagnosis Date  . Chlamydia   . Medical history non-contributory     Patient Active Problem List   Diagnosis Date Noted  . BV (bacterial vaginosis) 02/07/2011  . Trichomonal vulvovaginitis 10/12/2010  . Tooth caries 10/12/2010  . Fatigue 10/12/2010  . Risky sexual behavior 10/12/2010  . Substance abuse in remission (HCC) 08/29/2010  . Painful sexual intercourse 08/29/2010  . Fibrocystic breast changes 08/28/2010  . Tobacco abuse, in remission 08/28/2010    Past Surgical History:  Procedure Laterality Date  . APPENDECTOMY    . HERNIA REPAIR    . TUBAL LIGATION       OB History    Gravida  4   Para  3   Term  3   Preterm      AB  1   Living  3     SAB      TAB  1   Ectopic      Multiple      Live Births  3            Home Medications    Prior to Admission medications   Medication Sig Start Date End Date Taking? Authorizing Provider  omeprazole (PRILOSEC) 40 MG capsule Take 1 capsule (40 mg total) by mouth daily. Patient  taking differently: Take 40 mg by mouth daily as needed (heartburn).  08/10/16  Yes Ward, Layla MawKristen N, DO  doxycycline (VIBRAMYCIN) 100 MG capsule Take 1 capsule (100 mg total) by mouth 2 (two) times daily. 09/11/17   Kartel Wolbert, Cristal Deerhristopher, PA-C  ibuprofen (ADVIL,MOTRIN) 800 MG tablet Take 1 tablet (800 mg total) by mouth every 8 (eight) hours as needed. 09/11/17   Eara Burruel, Cristal Deerhristopher, PA-C  ondansetron (ZOFRAN ODT) 4 MG disintegrating tablet Take 1 tablet (4 mg total) by mouth every 8 (eight) hours as needed for nausea or vomiting. Patient not taking: Reported on 09/11/2017 08/10/16   Ward, Layla MawKristen N, DO  traMADol (ULTRAM) 50 MG tablet Take 1 tablet (50 mg total) by mouth every 6 (six) hours as needed for severe pain. 09/11/17   Charlestine NightLawyer, Temeka Pore, PA-C    Family History Family History  Problem Relation Age of Onset  . Heart disease Mother   . Hyperlipidemia Mother   . Hypertension Mother   . Diabetes Father   . Heart disease Father   . Hyperlipidemia Father   . Hypertension Father   . Cancer Father   . Stroke Father   . Asthma Maternal Aunt     Social History Social History   Tobacco Use  . Smoking status: Current  Every Day Smoker    Packs/day: 0.50    Types: Cigars  . Smokeless tobacco: Never Used  Substance Use Topics  . Alcohol use: Yes    Comment: Pt only has a drink a few times a year.   . Drug use: No    Comment: Past Marijuana use.      Allergies   Patient has no known allergies.   Review of Systems Review of Systems All other systems negative except as documented in the HPI. All pertinent positives and negatives as reviewed in the HPI.  Physical Exam Updated Vital Signs BP 122/76   Pulse 65   Temp 98.6 F (37 C) (Oral)   Resp 16   Ht 5\' 4"  (1.626 m)   Wt 54.4 kg (120 lb)   LMP 09/06/2017   SpO2 96%   BMI 20.60 kg/m   Physical Exam  Constitutional: She is oriented to person, place, and time. She appears well-developed and well-nourished. No distress.  HENT:    Head: Normocephalic and atraumatic.  Mouth/Throat: Oropharynx is clear and moist.  Eyes: Pupils are equal, round, and reactive to light.  Neck: Normal range of motion. Neck supple.  Cardiovascular: Normal rate, regular rhythm and normal heart sounds. Exam reveals no gallop and no friction rub.  No murmur heard. Pulmonary/Chest: Effort normal and breath sounds normal. No respiratory distress. She has no wheezes.  Abdominal: Soft. Bowel sounds are normal. She exhibits no distension. There is tenderness in the right lower quadrant, periumbilical area, suprapubic area and left lower quadrant.  Genitourinary:  Genitourinary Comments: Deferred pelvic exam.  Neurological: She is alert and oriented to person, place, and time. She exhibits normal muscle tone. Coordination normal.  Skin: Skin is warm and dry. Capillary refill takes less than 2 seconds. No rash noted. No erythema.  Psychiatric: She has a normal mood and affect. Her behavior is normal.  Nursing note and vitals reviewed.    ED Treatments / Results  Labs (all labs ordered are listed, but only abnormal results are displayed) Labs Reviewed  COMPREHENSIVE METABOLIC PANEL - Abnormal; Notable for the following components:      Result Value   Potassium 3.3 (*)    Total Protein 6.4 (*)    ALT 13 (*)    All other components within normal limits  CBC - Abnormal; Notable for the following components:   RBC 3.80 (*)    Hemoglobin 9.7 (*)    HCT 31.4 (*)    MCH 25.5 (*)    RDW 17.2 (*)    All other components within normal limits  URINALYSIS, ROUTINE W REFLEX MICROSCOPIC - Abnormal; Notable for the following components:   APPearance HAZY (*)    Hgb urine dipstick SMALL (*)    Bacteria, UA RARE (*)    All other components within normal limits  LIPASE, BLOOD  I-STAT BETA HCG BLOOD, ED ([facility], [facility], [facility] ONLY)    EKG None  Radiology Ct Abdomen Pelvis W Contrast  Result Date: 09/11/2017 CLINICAL DATA:  Lower abdominal pain EXAM: CT  ABDOMEN AND PELVIS WITH CONTRAST TECHNIQUE: Multidetector CT imaging of the abdomen and pelvis was performed using the standard protocol following bolus administration of intravenous contrast. CONTRAST:  OMNIPAQUE IOHEXOL 300 MG/ML  SOLN COMPARISON:  None. FINDINGS: Lower chest: There is bibasilar atelectatic change, slightly more on the right than on the left. No airspace consolidation seen in the lung bases. Hepatobiliary: No focal liver lesions are appreciable. Gallbladder wall does not appear appreciably  thickened. There is no biliary duct dilatation. Pancreas: No pancreatic mass or inflammatory focus. Spleen: No splenic lesions are evident. Adrenals/Urinary Tract: Adrenals bilaterally appear unremarkable. Kidneys bilaterally show no evident mass or hydronephrosis on either side. There is no renal or ureteral calculus on either side. Urinary bladder is midline with wall thickness within normal limits. Stomach/Bowel: There is fairly diffuse stool throughout the colon. There is no appreciable bowel wall or mesenteric thickening. There is no evident bowel obstruction. No free air or portal venous air. Vascular/Lymphatic: There are foci of atherosclerotic calcification in the aorta and common iliac arteries. No evident aneurysm. Major mesenteric arterial vessels appear patent. There is no appreciable adenopathy in the abdomen or pelvis. Reproductive: The uterus is anteverted. Cervix appears prominent. Endometrium also appears prominent. There is no well-defined pelvic mass. There are presumed follicles arising from the right ovary. There is fluid tracking from the right adnexa into the cul-de-sac region. Other: Appendix absent. No abscess evident in the abdomen or pelvis. No ascites beyond the fluid noted in the cul-de-sac region. Musculoskeletal: There are no blastic or lytic bone lesions. No intramuscular or abdominal wall lesions evident. IMPRESSION: 1. Fluid tracks from the right adnexa into the  cul-de-sac, likely indicative of recent ovarian cyst rupture. 2. Endometrium appears prominent on this study. Cervix also appears prominent. Cervix may warrant direct physical examination to further evaluate. Pelvic ultrasound to assess the endometrium may be helpful for further assessment. 3. Fairly diffuse stool throughout colon. No bowel wall thickening or bowel obstruction. No abscess. Appendix absent. 4.  No evident renal or ureteral calculus.  No hydronephrosis. 5.  There is aortoiliac atherosclerosis. Aortic Atherosclerosis (ICD10-I70.0). Electronically Signed   By: Bretta Bang III M.D.   On: 09/11/2017 09:08    Procedures Procedures (including critical care time)  Medications Ordered in ED Medications  sodium chloride 0.9 % bolus 1,000 mL (0 mLs Intravenous Stopped 09/11/17 1105)  iohexol (OMNIPAQUE) 300 MG/ML solution 100 mL (100 mLs Intravenous Contrast Given 09/11/17 0814)  cefTRIAXone (ROCEPHIN) 1 g in sodium chloride 0.9 % 100 mL IVPB (0 g Intravenous Stopped 09/11/17 1105)     Initial Impression / Assessment and Plan / ED Course  I have reviewed the triage vital signs and the nursing notes.  Pertinent labs & imaging results that were available during my care of the patient were reviewed by me and considered in my medical decision making (see chart for details).     CT scan showed what appeared to be a ovarian cyst that has ruptured.  Vision is feeling better at this time.  We will discharge her home and have her follow-up with GYN.  I did treat for any pelvic infection as well based off of the CT scan findings.  Patient deferred any pelvic exam at this time  Final Clinical Impressions(s) / ED Diagnoses   Final diagnoses:  Pelvic pain in female  Cyst of right ovary    ED Discharge Orders        Ordered    traMADol (ULTRAM) 50 MG tablet  Every 6 hours PRN     09/11/17 1055    doxycycline (VIBRAMYCIN) 100 MG capsule  2 times daily     09/11/17 1055    ibuprofen  (ADVIL,MOTRIN) 800 MG tablet  Every 8 hours PRN     09/11/17 [address], PA-C 09/12/17 1550    Eber Hong, MD 09/13/17 2034

## 2017-10-13 ENCOUNTER — Other Ambulatory Visit (HOSPITAL_COMMUNITY)
Admission: RE | Admit: 2017-10-13 | Discharge: 2017-10-13 | Disposition: A | Discharge status: Home / Self Care | Payer: BLUE CROSS/BLUE SHIELD | Source: Ambulatory Visit | Attending: Obstetrics & Gynecology | Admitting: Obstetrics & Gynecology

## 2017-10-13 ENCOUNTER — Ambulatory Visit (INDEPENDENT_AMBULATORY_CARE_PROVIDER_SITE_OTHER): Payer: BLUE CROSS/BLUE SHIELD | Admitting: Obstetrics & Gynecology

## 2017-10-13 ENCOUNTER — Encounter: Payer: Self-pay | Admitting: Obstetrics & Gynecology

## 2017-10-13 VITALS — BP 119/83 | HR 81 | Wt 117.3 lb

## 2017-10-13 DIAGNOSIS — Z0000 Encounter for general adult medical examination without abnormal findings: Secondary | ICD-10-CM | POA: Diagnosis not present

## 2017-10-13 DIAGNOSIS — Z1231 Encounter for screening mammogram for malignant neoplasm of breast: Secondary | ICD-10-CM

## 2017-10-13 NOTE — Progress Notes (Signed)
Mammogram scheduled for July 30th @ 0730.  Pt notified.

## 2017-10-13 NOTE — Progress Notes (Signed)
Subjective:    Jordan Leblanc is a 42 y.o.single P3 female who presents for an annual exam. The patient has no complaints today. The patient is not currently sexually active. GYN screening history: last pap: was normal. The patient wears seatbelts: yes. The patient participates in regular exercise: yes. Has the patient ever been transfused or tattooed?: not asked. The patient reports that there is not domestic violence in her life.   Menstrual History: OB History    Gravida  4   Para  3   Term  3   Preterm      AB  1   Living  3     SAB      TAB  1   Ectopic      Multiple      Live Births  233           Menarche age: 89 Patient's last menstrual period was 09/25/2017 (approximate). Period Cycle (Days): 28 Period Duration (Days): 5-6 Period Pattern: Regular Menstrual Flow: Heavy Menstrual Control: Maxi pad, Tampon Menstrual Control Change Freq (Hours): q 2-3 hrs Dysmenorrhea: None  The following portions of the patient's history were reviewed and updated as appropriate: allergies, current medications, past family history, past medical history, past social history, past surgical history and problem list.  Review of Systems Pertinent items are noted in HPI.    Objective:    BP 119/83   Pulse 81   Wt 117 lb 4.8 oz (53.2 kg)   LMP 09/25/2017 (Approximate)   BMI 20.13 kg/m   General Appearance:    Alert, cooperative, no distress, appears stated age  Head:    Normocephalic, without obvious abnormality, atraumatic  Eyes:    PERRL, conjunctiva/corneas clear, EOM's intact, fundi    benign, both eyes  Ears:    Normal TM's and external ear canals, both ears  Nose:   Nares normal, septum midline, mucosa normal, no drainage    or sinus tenderness  Throat:   Lips, mucosa, and tongue normal; teeth and gums normal  Neck:   Supple, symmetrical, trachea midline, no adenopathy;    thyroid:  no enlargement/tenderness/nodules; no carotid   bruit or JVD  Back:     Symmetric,  no curvature, ROM normal, no CVA tenderness  Lungs:     Clear to auscultation bilaterally, respirations unlabored  Chest Wall:    No tenderness or deformity   Heart:    Regular rate and rhythm, S1 and S2 normal, no murmur, rub   or gallop  Breast Exam:    No tenderness, masses, or nipple abnormality  Abdomen:     Soft, non-tender, bowel sounds active all four quadrants,    no masses, no organomegaly  Genitalia:    Normal female without lesion, discharge or tenderness, normal size and shape, retroverted, mobile, slightly tender, normal adnexal exam      Extremities:   Extremities normal, atraumatic, no cyanosis or edema  Pulses:   2+ and symmetric all extremities  Skin:   Skin color, texture, turgor normal, no rashes or lesions  Lymph nodes:   Cervical, supraclavicular, and axillary nodes normal  Neurologic:   CNII-XII intact, normal strength, sensation and reflexes    throughout  .    Assessment:    Healthy female exam.    Plan:     Thin prep Pap smear. with cotesting Mammogram STI testing

## 2017-10-13 NOTE — Progress Notes (Signed)
   Subjective:    Patient ID: Jordan Leblanc, female    DOB: 07/07/1975, 42 y.o.   MRN: [id number]  HPI  42 yo divorced 3 (26, 4120, and 514 yo kids) here today for follow up after a [facility] visit last month for "ovary pain" for the previous 2 weeks. The pain is better.   Review of Systems Pap and mammogram due. Sex is about q 4 months, had a BTL.   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

## 2017-10-14 LAB — CYTOLOGY - PAP
Bacterial vaginitis: NEGATIVE
CHLAMYDIA, DNA PROBE: NEGATIVE
Candida vaginitis: NEGATIVE
Diagnosis: NEGATIVE
HPV (WINDOPATH): NOT DETECTED
NEISSERIA GONORRHEA: NEGATIVE
TRICH (WINDOWPATH): NEGATIVE

## 2017-10-14 LAB — HIV ANTIBODY (ROUTINE TESTING W REFLEX): HIV Screen 4th Generation wRfx: NONREACTIVE

## 2017-10-14 LAB — HEPATITIS B SURFACE ANTIGEN: Hepatitis B Surface Ag: NEGATIVE

## 2017-10-14 LAB — HEPATITIS C ANTIBODY: Hep C Virus Ab: <0.1 s/co ratio (ref 0.0–0.9)

## 2017-10-14 LAB — RPR: RPR Ser Ql: NONREACTIVE

## 2017-11-04 ENCOUNTER — Ambulatory Visit: Payer: BLUE CROSS/BLUE SHIELD

## 2017-12-03 ENCOUNTER — Ambulatory Visit: Payer: BLUE CROSS/BLUE SHIELD

## 2018-10-08 ENCOUNTER — Other Ambulatory Visit: Payer: Self-pay

## 2018-10-08 ENCOUNTER — Other Ambulatory Visit: Payer: BLUE CROSS/BLUE SHIELD

## 2018-10-08 DIAGNOSIS — Z20822 Contact with and (suspected) exposure to covid-19: Secondary | ICD-10-CM

## 2018-10-08 NOTE — Progress Notes (Unsigned)
[facility] testing site in [place], [place] accidentally ordered a Novel Coronavirus lab under this particular patient's chart (due to similar sounding name of another patient that presented for testing). Once error was realized registration/lab staff went into patient's chart to correct ordering error. No harm done to either patient. -DMG

## 2018-10-15 LAB — NOVEL CORONAVIRUS, NAA: SARS-CoV-2, NAA: NOT DETECTED

## 2018-10-26 ENCOUNTER — Ambulatory Visit: Payer: Self-pay | Admitting: *Deleted

## 2018-10-26 NOTE — Telephone Encounter (Signed)
I didn't talk with pt however agent asked that I check on her COVID-19 result.   Pt had it done but it is not showing up in the computer system.    There is an entry where the order was discontinued due to this pt's name being very similar to another pt's name.     I could not find anything else pertaining to the test in her chart. I recommended the agent let our Department Director know so she could find out something for the pt.

## 2018-10-28 ENCOUNTER — Telehealth: Payer: Self-pay | Admitting: General Practice

## 2018-10-28 NOTE — Telephone Encounter (Signed)
Informed pt of negative covid result   °

## 2018-11-09 ENCOUNTER — Other Ambulatory Visit: Payer: Self-pay

## 2018-11-09 ENCOUNTER — Other Ambulatory Visit: Payer: Medicaid Other

## 2018-11-09 DIAGNOSIS — Z20822 Contact with and (suspected) exposure to covid-19: Secondary | ICD-10-CM

## 2018-11-10 LAB — NOVEL CORONAVIRUS, NAA: SARS-CoV-2, NAA: NOT DETECTED

## 2018-11-13 ENCOUNTER — Telehealth: Payer: Self-pay | Admitting: General Practice

## 2018-11-13 NOTE — Telephone Encounter (Signed)
Patient called in for covid test results, gave patient results.

## 2019-10-05 ENCOUNTER — Other Ambulatory Visit: Payer: Self-pay | Admitting: Endocrinology

## 2019-10-05 DIAGNOSIS — Z1231 Encounter for screening mammogram for malignant neoplasm of breast: Secondary | ICD-10-CM

## 2019-10-27 ENCOUNTER — Ambulatory Visit
Admission: RE | Admit: 2019-10-27 | Discharge: 2019-10-27 | Disposition: A | Discharge status: Home / Self Care | Payer: 59 | Source: Ambulatory Visit | Attending: Endocrinology | Admitting: Endocrinology

## 2019-10-27 ENCOUNTER — Other Ambulatory Visit: Payer: Self-pay

## 2019-10-27 DIAGNOSIS — Z1231 Encounter for screening mammogram for malignant neoplasm of breast: Secondary | ICD-10-CM

## 2020-12-07 ENCOUNTER — Other Ambulatory Visit: Payer: Self-pay | Admitting: Endocrinology

## 2020-12-07 ENCOUNTER — Other Ambulatory Visit: Payer: Self-pay

## 2020-12-07 ENCOUNTER — Ambulatory Visit
Admission: RE | Admit: 2020-12-07 | Discharge: 2020-12-07 | Disposition: A | Discharge status: Home / Self Care | Payer: Medicaid Other | Source: Ambulatory Visit | Attending: Endocrinology | Admitting: Endocrinology

## 2020-12-07 DIAGNOSIS — Z1231 Encounter for screening mammogram for malignant neoplasm of breast: Secondary | ICD-10-CM

## 2021-07-10 ENCOUNTER — Ambulatory Visit (INDEPENDENT_AMBULATORY_CARE_PROVIDER_SITE_OTHER): Payer: Medicaid Other | Admitting: Podiatry

## 2021-07-10 DIAGNOSIS — B351 Tinea unguium: Secondary | ICD-10-CM

## 2021-07-10 MED ORDER — TERBINAFINE HCL 250 MG PO TABS: 250.0000 mg | ORAL_TABLET | Freq: Every day | ORAL | 0 refills | Status: AC

## 2021-07-11 ENCOUNTER — Telehealth: Payer: Self-pay | Admitting: *Deleted

## 2021-07-11 NOTE — Telephone Encounter (Signed)
Daphne w/ [facility] is calling to request office visit notes, demographics , referral came into to office unclear.  ?Please advise when notes are ready in epic to view. ?

## 2021-07-15 NOTE — Progress Notes (Signed)
?  Subjective:  ?Patient ID: Jordan Leblanc, female    DOB: Jan 31, 1976,  MRN: [id number] ? ?Chief Complaint  ?Patient presents with  ? Nail Problem  ?  Right hallux- pt reports she went for pedicure at nail salon in Dec and she now has noticed her toenail become thick and discolor- concern with poss fungus in nail- also concern with the numbness at the tip of toes ( bilat hallux)   ? ? ?46 y.o. female presents with the above complaint. History confirmed with patient.  She had a pedicure in December and noted discoloration developing since then. ? ?Objective:  ?Physical Exam: ?warm, good capillary refill, no trophic changes or ulcerative lesions, normal DP and PT pulses, normal sensory exam, and onychomycosis. ? ? ? ? ?Assessment:  ?No diagnosis found. ? ? ?Plan:  ?Patient was evaluated and treated and all questions answered. ? ?Onychomycosis ?Discussed etiology and treatment options of onychomycosis in detail with the patient.  We discussed oral and topical treatment.  I recommended oral treatment with Lamisil.  90-day course sent to her pharmacy.  Photographs were taken today.  I will see her back in 3 months for follow-up. ? ?Return in about 4 months (around 11/09/2021) for follow up after nail fungus treatment.  ? ?

## 2021-07-17 NOTE — Telephone Encounter (Signed)
Faxed lov notes to [facility], confirmation received-07/17/21. ?

## 2021-07-30 ENCOUNTER — Other Ambulatory Visit: Payer: Self-pay | Admitting: Neurosurgery

## 2021-07-30 DIAGNOSIS — M5410 Radiculopathy, site unspecified: Secondary | ICD-10-CM

## 2021-08-04 ENCOUNTER — Ambulatory Visit
Admission: RE | Admit: 2021-08-04 | Discharge: 2021-08-04 | Disposition: A | Discharge status: Home / Self Care | Payer: Medicaid Other | Source: Ambulatory Visit | Attending: Neurosurgery | Admitting: Neurosurgery

## 2021-08-04 DIAGNOSIS — M5410 Radiculopathy, site unspecified: Secondary | ICD-10-CM

## 2021-11-13 ENCOUNTER — Ambulatory Visit: Payer: Medicaid Other | Admitting: Podiatry

## 2024-02-18 IMAGING — MR MR LUMBAR SPINE W/O CM
4 of 5 series · 18 of 48 positions shown · non-contrast
Comparison: No prior MRI, correlation is made with lumbar spine
radiographs 07/27/2021

CLINICAL DATA: Low back pain, radiates down to legs

EXAM:
MRI LUMBAR SPINE WITHOUT CONTRAST
TECHNIQUE: Multiplanar, multisequence MR imaging of the lumbar spine was
performed. No intravenous contrast was administered.

[Series 6: T2 · sagittal · 4.0mm · 0.73mm/px · 6 of 15 slices shown (1 of 2)]
[im 1/15]
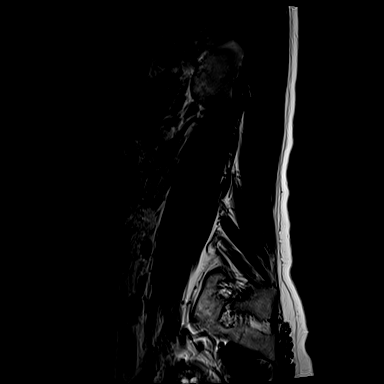
[im 3/15]
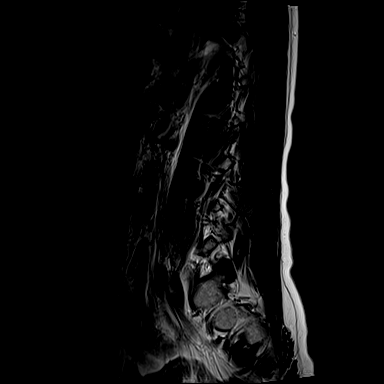
[im 6/15]
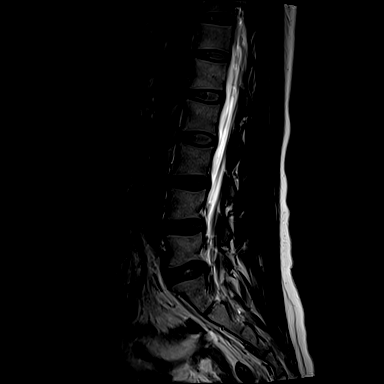
[im 9/15]
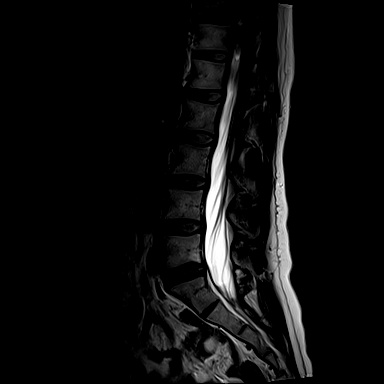
[im 12/15]
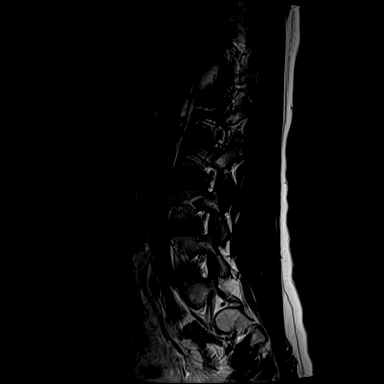
[im 15/15]
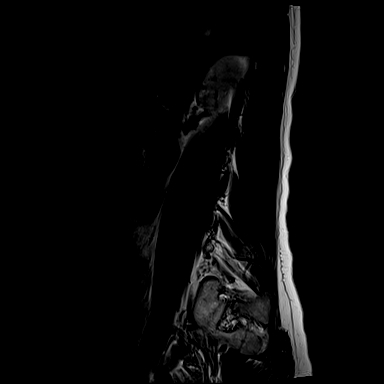

[Series 7: T1 · sagittal · 4.0mm · 0.73mm/px · 3 of 15 slices shown (1 of 2)]
[im 3/15]
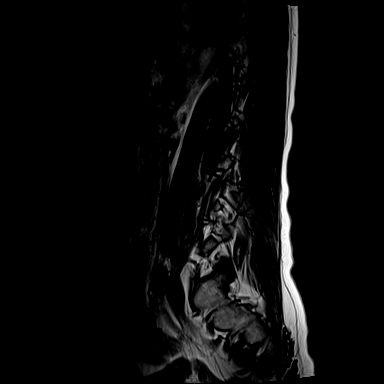
[im 9/15]
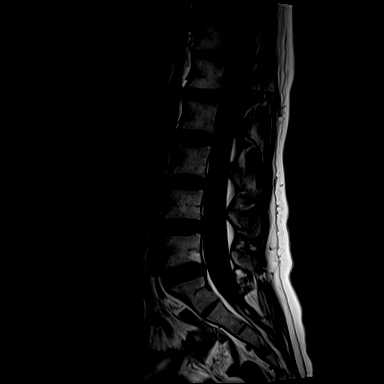
[im 15/15]
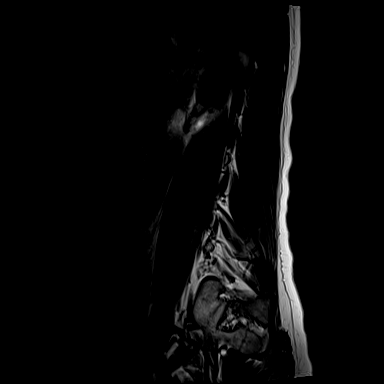

[Series 11: T1 · axial · 4.0mm · 0.28mm/px · z∈[+16,+154]mm · 3 of 38 slices shown (2 of 2)]
[im 6/38]
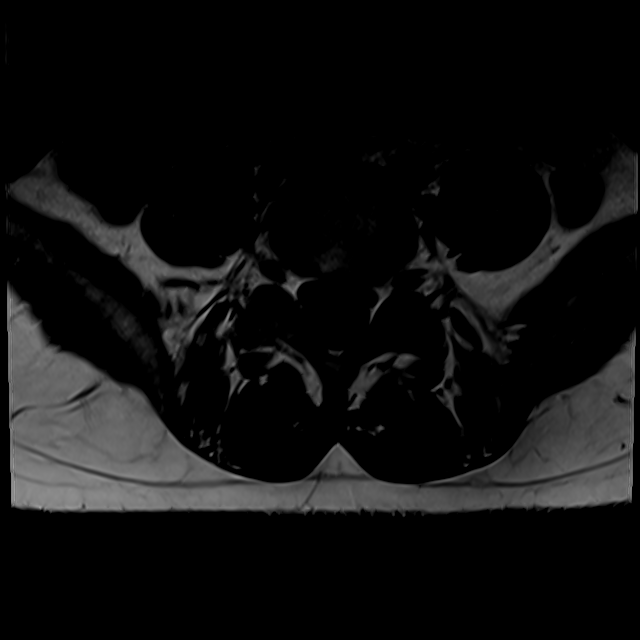
[im 19/38]
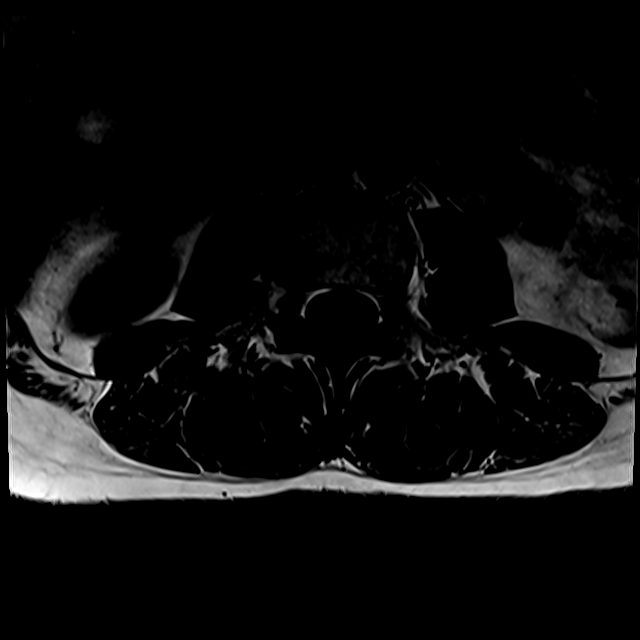
[im 32/38]
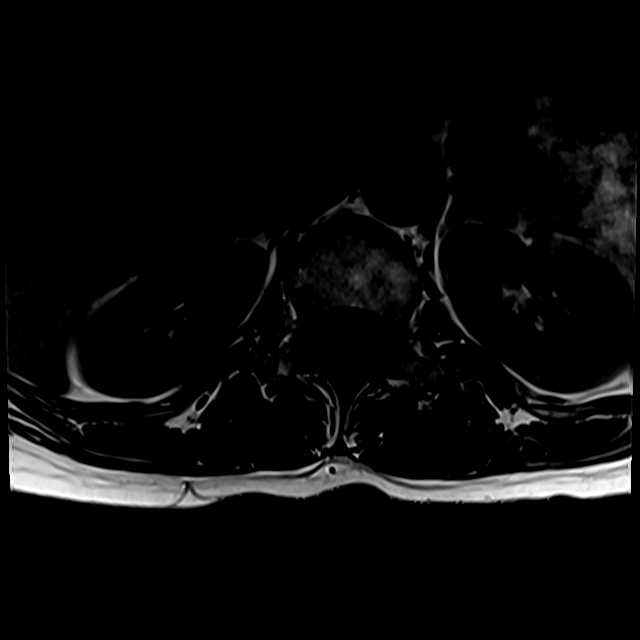

[Series 14: T2 · axial · 4.0mm · 0.28mm/px · z∈[-9,+154]mm · 6 of 38 slices shown (2 of 2)]
[im 1/38]
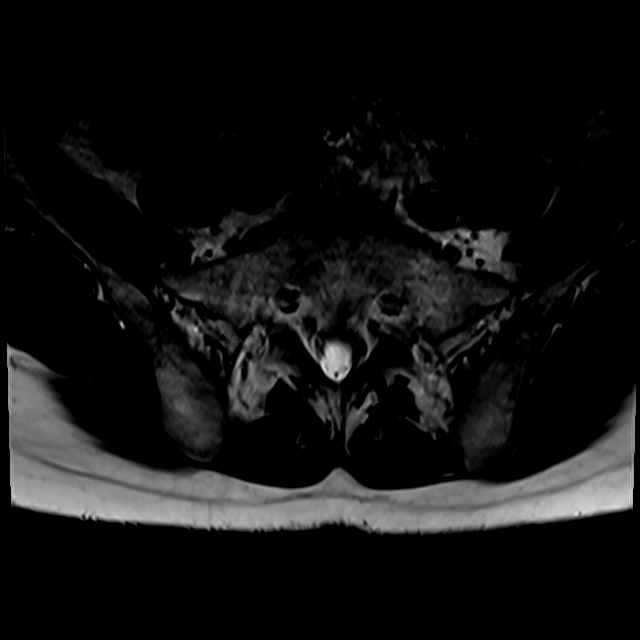
[im 6/38]
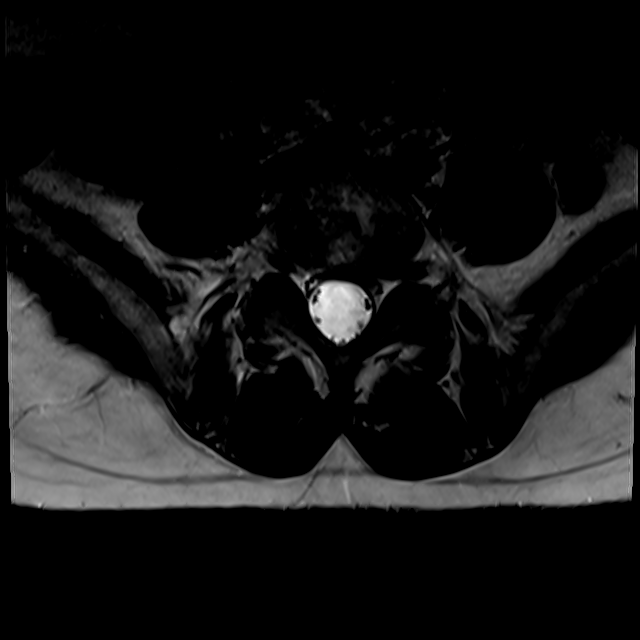
[im 11/38]
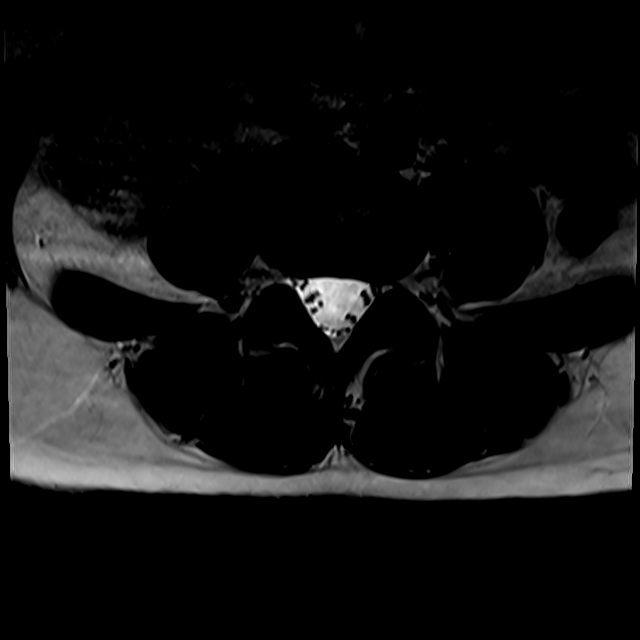
[im 16/38]
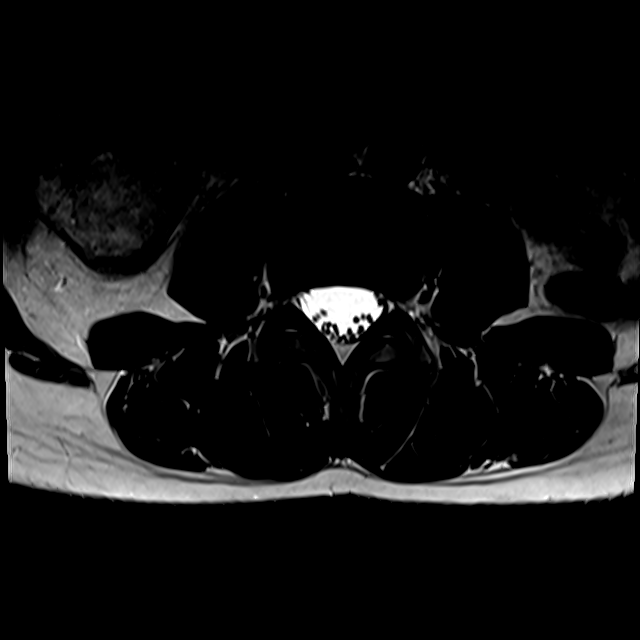
[im 19/38]
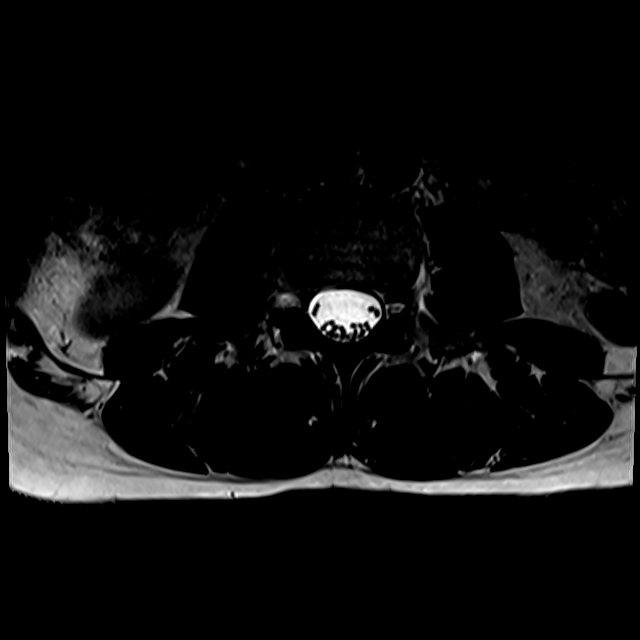
[im 32/38]
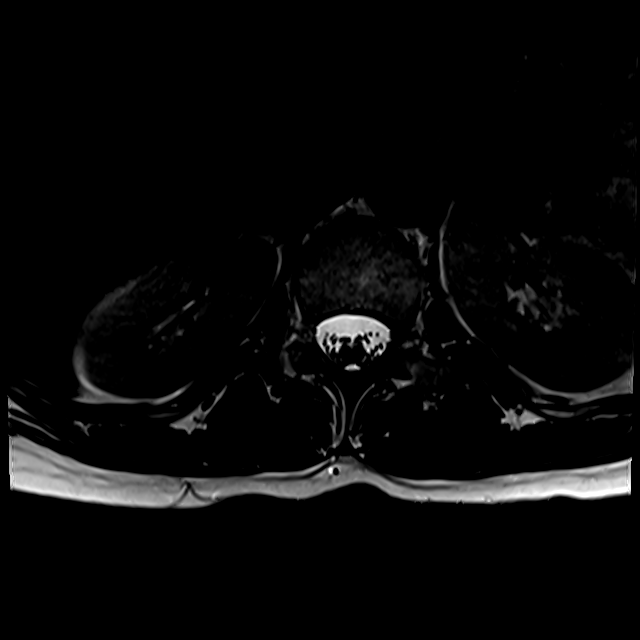

[18 of 48 positions shown; findings below may reference images not displayed]

FINDINGS: Segmentation:  Standard.

Alignment:  Physiologic.

Vertebrae:  No fracture, evidence of discitis, or bone lesion.

Conus medullaris and cauda equina: Conus extends to the L1-L2 level.
Conus and cauda equina appear normal.

Paraspinal and other soft tissues: No acute finding.

Disc levels:

T12-L1: No significant disc bulge. No spinal canal stenosis or
neural foraminal narrowing.

L1-L2: No significant disc bulge. No spinal canal stenosis or neural
foraminal narrowing.

L2-L3: No significant disc bulge. No spinal canal stenosis or neural
foraminal narrowing.

L3-L4: No significant disc bulge. Mild facet arthropathy. No spinal
canal stenosis. Mild right neural foraminal narrowing.

L4-L5: No significant disc bulge. No spinal canal stenosis or neural
foraminal narrowing.

L5-S1: No significant disc bulge. No spinal canal stenosis or neural
foraminal narrowing.
IMPRESSION: Minimal degenerative changes with L3-L4 mild facet arthropathy and
mild right neural foraminal narrowing. No spinal canal stenosis.
